# Patient Record
Sex: Female | Born: 1945 | Race: White | Hispanic: No | Smoking: Never smoker
Health system: Southern US, Community
[De-identification: ages and names within clinical notes are randomized; demographics above are authoritative.]

## PROBLEM LIST (undated history)

## (undated) DIAGNOSIS — I38 Endocarditis, valve unspecified: Secondary | ICD-10-CM

## (undated) DIAGNOSIS — R42 Dizziness and giddiness: Secondary | ICD-10-CM

## (undated) DIAGNOSIS — I4510 Unspecified right bundle-branch block: Secondary | ICD-10-CM

## (undated) DIAGNOSIS — R251 Tremor, unspecified: Secondary | ICD-10-CM

## (undated) DIAGNOSIS — M4056 Lordosis, unspecified, lumbar region: Secondary | ICD-10-CM

## (undated) DIAGNOSIS — F3160 Bipolar disorder, current episode mixed, unspecified: Secondary | ICD-10-CM

## (undated) DIAGNOSIS — N83209 Unspecified ovarian cyst, unspecified side: Secondary | ICD-10-CM

## (undated) DIAGNOSIS — H25019 Cortical age-related cataract, unspecified eye: Secondary | ICD-10-CM

## (undated) DIAGNOSIS — M1990 Unspecified osteoarthritis, unspecified site: Secondary | ICD-10-CM

## (undated) DIAGNOSIS — N39498 Other specified urinary incontinence: Secondary | ICD-10-CM

## (undated) DIAGNOSIS — E785 Hyperlipidemia, unspecified: Secondary | ICD-10-CM

## (undated) DIAGNOSIS — N189 Chronic kidney disease, unspecified: Secondary | ICD-10-CM

## (undated) DIAGNOSIS — H9190 Unspecified hearing loss, unspecified ear: Secondary | ICD-10-CM

## (undated) DIAGNOSIS — N390 Urinary tract infection, site not specified: Secondary | ICD-10-CM

## (undated) DIAGNOSIS — G629 Polyneuropathy, unspecified: Secondary | ICD-10-CM

## (undated) DIAGNOSIS — I10 Essential (primary) hypertension: Secondary | ICD-10-CM

## (undated) DIAGNOSIS — R9431 Abnormal electrocardiogram [ECG] [EKG]: Secondary | ICD-10-CM

## (undated) DIAGNOSIS — C801 Malignant (primary) neoplasm, unspecified: Secondary | ICD-10-CM

## (undated) DIAGNOSIS — I499 Cardiac arrhythmia, unspecified: Secondary | ICD-10-CM

## (undated) HISTORY — PX: TUBAL LIGATION: SHX77

## (undated) HISTORY — PX: CATARACT EXTRACTION: SUR2

## (undated) HISTORY — PX: BREAST BIOPSY: SHX20

## (undated) HISTORY — PX: EYE SURGERY: SHX253

## (undated) HISTORY — PX: DIAGNOSTIC LAPAROSCOPY: SUR761

## (undated) HISTORY — PX: BREAST EXCISIONAL BIOPSY: SUR124

## (undated) HISTORY — PX: COLONOSCOPY: SHX174

---

## 2006-11-25 ENCOUNTER — Ambulatory Visit: Payer: Self-pay | Admitting: Ophthalmology

## 2008-07-02 DIAGNOSIS — K5790 Diverticulosis of intestine, part unspecified, without perforation or abscess without bleeding: Secondary | ICD-10-CM

## 2008-07-02 HISTORY — DX: Diverticulosis of intestine, part unspecified, without perforation or abscess without bleeding: K57.90

## 2011-10-11 ENCOUNTER — Ambulatory Visit: Payer: Self-pay | Admitting: Ophthalmology

## 2011-10-31 ENCOUNTER — Ambulatory Visit: Payer: Self-pay | Admitting: Ophthalmology

## 2012-03-14 ENCOUNTER — Ambulatory Visit: Payer: Self-pay | Admitting: Gastroenterology

## 2012-07-02 HISTORY — PX: FOOT SURGERY: SHX648

## 2012-12-31 ENCOUNTER — Ambulatory Visit: Payer: Self-pay | Admitting: Podiatry

## 2013-03-05 ENCOUNTER — Ambulatory Visit: Payer: Self-pay | Admitting: Family Medicine

## 2013-12-22 DIAGNOSIS — Z85828 Personal history of other malignant neoplasm of skin: Secondary | ICD-10-CM | POA: Insufficient documentation

## 2014-03-09 ENCOUNTER — Ambulatory Visit: Payer: Self-pay | Admitting: Family Medicine

## 2014-03-24 DIAGNOSIS — F319 Bipolar disorder, unspecified: Secondary | ICD-10-CM | POA: Insufficient documentation

## 2014-03-24 DIAGNOSIS — M129 Arthropathy, unspecified: Secondary | ICD-10-CM | POA: Insufficient documentation

## 2014-10-29 ENCOUNTER — Other Ambulatory Visit: Payer: Self-pay | Admitting: Obstetrics and Gynecology

## 2014-10-29 DIAGNOSIS — N6459 Other signs and symptoms in breast: Secondary | ICD-10-CM

## 2014-10-29 DIAGNOSIS — Z13820 Encounter for screening for osteoporosis: Secondary | ICD-10-CM

## 2014-11-11 ENCOUNTER — Other Ambulatory Visit: Payer: Self-pay

## 2014-11-11 ENCOUNTER — Ambulatory Visit: Payer: Self-pay

## 2014-11-17 ENCOUNTER — Other Ambulatory Visit: Payer: Self-pay

## 2014-11-17 ENCOUNTER — Ambulatory Visit: Payer: Self-pay

## 2014-12-02 ENCOUNTER — Other Ambulatory Visit: Payer: Self-pay

## 2014-12-02 ENCOUNTER — Ambulatory Visit: Payer: Self-pay

## 2014-12-06 ENCOUNTER — Ambulatory Visit
Admission: RE | Admit: 2014-12-06 | Discharge: 2014-12-06 | Disposition: A | Discharge status: Home / Self Care | Payer: Medicare Other | Source: Ambulatory Visit | Attending: Obstetrics and Gynecology | Admitting: Obstetrics and Gynecology

## 2014-12-06 DIAGNOSIS — N6459 Other signs and symptoms in breast: Secondary | ICD-10-CM | POA: Diagnosis present

## 2014-12-06 DIAGNOSIS — Z13820 Encounter for screening for osteoporosis: Secondary | ICD-10-CM | POA: Insufficient documentation

## 2014-12-06 HISTORY — DX: Malignant (primary) neoplasm, unspecified: C80.1

## 2014-12-08 ENCOUNTER — Other Ambulatory Visit: Payer: Self-pay | Admitting: Obstetrics and Gynecology

## 2014-12-08 DIAGNOSIS — Z1231 Encounter for screening mammogram for malignant neoplasm of breast: Secondary | ICD-10-CM

## 2015-04-20 NOTE — Anesthesia Preprocedure Evaluation (Addendum)
Anesthesia Evaluation  Patient identified by MRN, date of birth, ID band  Reviewed: Allergy & Precautions, H&P , NPO status , Patient's Chart, lab work & pertinent test results  Airway Mallampati: II  TM Distance: >3 FB Neck ROM: full    Dental no notable dental hx.    Pulmonary    Pulmonary exam normal        Cardiovascular  Rhythm:regular Rate:Normal  An echocardiogram performed last year has shown mild LV systolic dysfunction with EF45% and mild mitral and tricuspid insufficiency. Heart rate and blood pressure remain well-controlled on beta blocker. She also remains on pravastatin, with LDL at goal.  No symptoms, clinically stable and cardiology felt medically optimized.   Neuro/Psych PSYCHIATRIC DISORDERS    GI/Hepatic   Endo/Other    Renal/GU      Musculoskeletal   Abdominal   Peds  Hematology   Anesthesia Other Findings   Reproductive/Obstetrics                            Anesthesia Physical Anesthesia Plan  ASA: II  Anesthesia Plan: MAC and Regional   Post-op Pain Management:    Induction:   Airway Management Planned:   Additional Equipment:   Intra-op Plan:   Post-operative Plan:   Informed Consent: I have reviewed the patients History and Physical, chart, labs and discussed the procedure including the risks, benefits and alternatives for the proposed anesthesia with the patient or authorized representative who has indicated his/her understanding and acceptance.     Plan Discussed with: CRNA  Anesthesia Plan Comments:         Anesthesia Quick Evaluation

## 2015-04-26 NOTE — Discharge Instructions (Signed)
[facility] °[facility] ° °POST OPERATIVE INSTRUCTIONS FOR DR. TROXLER AND DR. FOWLER °[facility] ° ° °1. Take your medication as prescribed.  Pain medication should be taken only as needed. ° °2. Keep the dressing clean, dry and intact. ° °3. Keep your foot elevated above the heart level for the first 48 hours. ° °4. Walking to the bathroom and brief periods of walking are acceptable, unless we have instructed you to be non-weight bearing. ° °5. Always wear your post-op shoe when walking.  Always use your crutches if you are to be non-weight bearing. ° °6. Do not take a shower. Baths are permissible as long as the foot is kept out of the water.  ° °7. Every hour you are awake:  °- Bend your knee 15 times. °- Flex foot 15 times °- Massage calf 15 times ° °8. Call [facility] ([phone]) if any of the following problems occur: °- You develop a temperature or fever. °- The bandage becomes saturated with blood. °- Medication does not stop your pain. °- Injury of the foot occurs. °- Any symptoms of infection including redness, odor, or red streaks running from wound. ° °General Anesthesia, Adult, Care After °Refer to this sheet in the next few weeks. These instructions provide you with information on caring for yourself after your procedure. Your health care provider may also give you more specific instructions. Your treatment has been planned according to current medical practices, but problems sometimes occur. Call your health care provider if you have any problems or questions after your procedure. °WHAT TO EXPECT AFTER THE PROCEDURE °After the procedure, it is typical to experience: °· Sleepiness. °· Nausea and vomiting. °HOME CARE INSTRUCTIONS °· For the first 24 hours after general anesthesia: °¨ Have a responsible person with you. °¨ Do not drive a car. If you are alone, do not take public transportation. °¨ Do not drink alcohol. °¨ Do not take  medicine that has not been prescribed by your health care provider. °¨ Do not sign important papers or make important decisions. °¨ You may resume a normal diet and activities as directed by your health care provider. °· Change bandages (dressings) as directed. °· If you have questions or problems that seem related to general anesthesia, call the hospital and ask for the anesthetist or anesthesiologist on call. °SEEK MEDICAL CARE IF: °· You have nausea and vomiting that continue the day after anesthesia. °· You develop a rash. °SEEK IMMEDIATE MEDICAL CARE IF:  °· You have difficulty breathing. °· You have chest pain. °· You have any allergic problems. °  °This information is not intended to replace advice given to you by your health care provider. Make sure you discuss any questions you have with your health care provider. °  °Document Released: 09/24/2000 Document Revised: 07/09/2014 Document Reviewed: 10/17/2011 °Elsevier Interactive Patient Education ©2016 [employer] ° °

## 2015-04-27 ENCOUNTER — Ambulatory Visit: Payer: Medicare Other | Admitting: Anesthesiology

## 2015-04-27 ENCOUNTER — Ambulatory Visit
Admission: RE | Admit: 2015-04-27 | Discharge: 2015-04-27 | Disposition: A | Discharge status: Home / Self Care | Payer: Medicare Other | Source: Ambulatory Visit | Attending: Podiatry | Admitting: Podiatry

## 2015-04-27 ENCOUNTER — Encounter
Admission: RE | Disposition: A | Discharge status: Home / Self Care | Payer: Self-pay | Source: Ambulatory Visit | Attending: Podiatry

## 2015-04-27 DIAGNOSIS — Q6621 Congenital metatarsus primus varus: Secondary | ICD-10-CM | POA: Diagnosis not present

## 2015-04-27 DIAGNOSIS — M2012 Hallux valgus (acquired), left foot: Secondary | ICD-10-CM | POA: Diagnosis not present

## 2015-04-27 DIAGNOSIS — M2042 Other hammer toe(s) (acquired), left foot: Secondary | ICD-10-CM | POA: Insufficient documentation

## 2015-04-27 HISTORY — PX: HAMMER TOE SURGERY: SHX385

## 2015-04-27 HISTORY — DX: Cardiac arrhythmia, unspecified: I49.9

## 2015-04-27 HISTORY — PX: METATARSAL OSTEOTOMY: SHX1641

## 2015-04-27 HISTORY — DX: Unspecified right bundle-branch block: I45.10

## 2015-04-27 HISTORY — DX: Urinary tract infection, site not specified: N39.0

## 2015-04-27 HISTORY — DX: Unspecified osteoarthritis, unspecified site: M19.90

## 2015-04-27 HISTORY — DX: Polyneuropathy, unspecified: G62.9

## 2015-04-27 HISTORY — DX: Unspecified hearing loss, unspecified ear: H91.90

## 2015-04-27 HISTORY — DX: Dizziness and giddiness: R42

## 2015-04-27 HISTORY — DX: Bipolar disorder, current episode mixed, unspecified: F31.60

## 2015-04-27 HISTORY — DX: Lordosis, unspecified, lumbar region: M40.56

## 2015-04-27 HISTORY — DX: Hyperlipidemia, unspecified: E78.5

## 2015-04-27 SURGERY — CORRECTION, HAMMER TOE
Anesthesia: Monitor Anesthesia Care | Laterality: Left | Wound class: Clean

## 2015-04-27 MED ORDER — PROPOFOL 500 MG/50ML IV EMUL
INTRAVENOUS | Status: DC | PRN
  Administered 2015-04-27: 100 ug/kg/min via INTRAVENOUS

## 2015-04-27 MED ORDER — ONDANSETRON HCL 4 MG/2ML IJ SOLN: 4.0000 mg | Freq: Four times a day (QID) | INTRAMUSCULAR | Status: DC | PRN

## 2015-04-27 MED ORDER — LIDOCAINE HCL (PF) 1 % IJ SOLN
INTRAMUSCULAR | Status: DC | PRN
  Administered 2015-04-27: 5 mL

## 2015-04-27 MED ORDER — ROPIVACAINE HCL 5 MG/ML IJ SOLN: INTRAMUSCULAR | Status: DC | PRN

## 2015-04-27 MED ORDER — MIDAZOLAM HCL 2 MG/2ML IJ SOLN
INTRAMUSCULAR | Status: DC | PRN
  Administered 2015-04-27: 2 mg via INTRAVENOUS
  Administered 2015-04-27: 1 mg via INTRAVENOUS

## 2015-04-27 MED ORDER — CEFAZOLIN SODIUM-DEXTROSE 2-3 GM-% IV SOLR
2.0000 g | Freq: Once | INTRAVENOUS | Status: AC
  Administered 2015-04-27: 2 g via INTRAVENOUS

## 2015-04-27 MED ORDER — ROPIVACAINE HCL 5 MG/ML IJ SOLN
INTRAMUSCULAR | Status: DC | PRN
  Administered 2015-04-27: 35 mL via PERINEURAL

## 2015-04-27 MED ORDER — ONDANSETRON HCL 4 MG/2ML IJ SOLN
INTRAMUSCULAR | Status: DC | PRN
  Administered 2015-04-27: 4 mg via INTRAVENOUS

## 2015-04-27 MED ORDER — ONDANSETRON HCL 4 MG/2ML IJ SOLN: 4.0000 mg | Freq: Once | INTRAMUSCULAR | Status: DC | PRN

## 2015-04-27 MED ORDER — FENTANYL CITRATE (PF) 100 MCG/2ML IJ SOLN
25.0000 ug | INTRAMUSCULAR | Status: DC | PRN
  Administered 2015-04-27 (×2): 25 ug via INTRAVENOUS

## 2015-04-27 MED ORDER — ACETAMINOPHEN 10 MG/ML IV SOLN
1000.0000 mg | Freq: Once | INTRAVENOUS | Status: AC
  Administered 2015-04-27: 1000 mg via INTRAVENOUS

## 2015-04-27 MED ORDER — HYDROCODONE-ACETAMINOPHEN 5-325 MG PO TABS: 1.0000 | ORAL_TABLET | Freq: Four times a day (QID) | ORAL | Status: DC | PRN

## 2015-04-27 MED ORDER — FENTANYL CITRATE (PF) 100 MCG/2ML IJ SOLN
INTRAMUSCULAR | Status: DC | PRN
  Administered 2015-04-27: 50 ug via INTRAVENOUS

## 2015-04-27 MED ORDER — DEXAMETHASONE SODIUM PHOSPHATE 4 MG/ML IJ SOLN
INTRAMUSCULAR | Status: DC | PRN
  Administered 2015-04-27: 4 mg via PERINEURAL

## 2015-04-27 MED ORDER — LACTATED RINGERS IV SOLN
INTRAVENOUS | Status: DC
  Administered 2015-04-27: 11:00:00 via INTRAVENOUS

## 2015-04-27 MED ORDER — ONDANSETRON HCL 4 MG PO TABS: 4.0000 mg | ORAL_TABLET | Freq: Four times a day (QID) | ORAL | Status: DC | PRN

## 2015-04-27 MED ORDER — EPHEDRINE SULFATE 50 MG/ML IJ SOLN
INTRAMUSCULAR | Status: DC | PRN
  Administered 2015-04-27 (×3): 5 mg via INTRAVENOUS
  Administered 2015-04-27: 10 mg via INTRAVENOUS
  Administered 2015-04-27: 5 mg via INTRAVENOUS

## 2015-04-27 SURGICAL SUPPLY — 59 items
BENZOIN TINCTURE PRP APPL 2/3 (GAUZE/BANDAGES/DRESSINGS) ×2 IMPLANT
BIT DRILL 1.5X30 QC DISP (BIT) ×2 IMPLANT
BIT DRILL 2.0 (BIT) ×1
BIT DRILL 2XNS DISP SS SM FRAG (BIT) ×1 IMPLANT
BIT DRL 2XNS DISP SS SM FRAG (BIT) ×1
BLADE MED AGGRESSIVE (BLADE) ×2 IMPLANT
BLADE MINI RND TIP GREEN BEAV (BLADE) ×2 IMPLANT
BLADE OSC/SAGITTAL 5.5X25 (BLADE) IMPLANT
BLADE OSC/SAGITTAL MD 5.5X18 (BLADE) ×2 IMPLANT
BLADE OSC/SAGITTAL MD 9X18.5 (BLADE) ×2 IMPLANT
BLADE SURG 15 STRL LF DISP TIS (BLADE) IMPLANT
BLADE SURG 15 STRL SS (BLADE)
BNDG ESMARK 4X12 TAN STRL LF (GAUZE/BANDAGES/DRESSINGS) ×2 IMPLANT
BNDG GAUZE 4.5X4.1 6PLY STRL (MISCELLANEOUS) ×2 IMPLANT
BNDG STRETCH 4X75 STRL LF (GAUZE/BANDAGES/DRESSINGS) ×2 IMPLANT
CANISTER SUCT 1200ML W/VALVE (MISCELLANEOUS) ×2 IMPLANT
CHLORAPREP W/TINT 26ML (MISCELLANEOUS) ×2 IMPLANT
COVER PIN YLW 0.028-062 (MISCELLANEOUS) IMPLANT
CUFF TOURNIQUET DUAL PORT 18X3 (MISCELLANEOUS) ×2 IMPLANT
DRAPE FLUOR MINI C-ARM 54X84 (DRAPES) ×2 IMPLANT
DURAPREP 26ML APPLICATOR (WOUND CARE) IMPLANT
FIXATION HAMMERTOE ANGLD 15MM (Toe) ×1 IMPLANT
GAUZE PETRO XEROFOAM 1X8 (MISCELLANEOUS) ×2 IMPLANT
GAUZE PETRO XEROFOAM 5X9 (MISCELLANEOUS) IMPLANT
GAUZE SPONGE 4X4 12PLY STRL (GAUZE/BANDAGES/DRESSINGS) ×2 IMPLANT
GLOVE BIO SURGEON STRL SZ7.5 (GLOVE) ×2 IMPLANT
GLOVE INDICATOR 8.0 STRL GRN (GLOVE) ×2 IMPLANT
GOWN STRL REUS W/ TWL LRG LVL3 (GOWN DISPOSABLE) ×2 IMPLANT
GOWN STRL REUS W/TWL LRG LVL3 (GOWN DISPOSABLE) ×2
HAMMERTOE ANGLED 15MM 5MM (Toe) ×2 IMPLANT
K-WIRE DBL END TROCAR 6X.045 (WIRE) ×2
K-WIRE DBL END TROCAR 6X.062 (WIRE) ×2
KIT DRILL HAMMERLOCK2 IMPLANT (BIT) ×2 IMPLANT
KWIRE DBL END TROCAR 6X.045 (WIRE) ×1 IMPLANT
KWIRE DBL END TROCAR 6X.062 (WIRE) ×1 IMPLANT
NS IRRIG 500ML POUR BTL (IV SOLUTION) ×2 IMPLANT
PACK EXTREMITY ARMC (MISCELLANEOUS) ×2 IMPLANT
PAD GROUND ADULT SPLIT (MISCELLANEOUS) ×2 IMPLANT
RASP SM TEAR CROSS CUT (RASP) ×2 IMPLANT
SCREW CORTICAL 2.0X12 (Screw) ×2 IMPLANT
SPLINT CAST 1 STEP 4X30 (MISCELLANEOUS) IMPLANT
STOCKINETTE STRL 6IN 960660 (GAUZE/BANDAGES/DRESSINGS) ×2 IMPLANT
SUT ETHILON 4-0 (SUTURE) ×1
SUT ETHILON 4-0 FS2 18XMFL BLK (SUTURE) ×1
SUT ETHILON 5-0 FS-2 18 BLK (SUTURE) IMPLANT
SUT MNCRL 4-0 (SUTURE)
SUT MNCRL 4-0 27XMFL (SUTURE)
SUT MNCRL 5-0+ PC-1 (SUTURE) ×1 IMPLANT
SUT MONOCRYL 5-0 (SUTURE) ×1
SUT VIC AB 0 CT1 36 (SUTURE) IMPLANT
SUT VIC AB 2-0 SH 27 (SUTURE)
SUT VIC AB 2-0 SH 27XBRD (SUTURE) IMPLANT
SUT VIC AB 3-0 SH 27 (SUTURE)
SUT VIC AB 3-0 SH 27X BRD (SUTURE) IMPLANT
SUT VIC AB 4-0 FS2 27 (SUTURE) ×2 IMPLANT
SUT VICRYL AB 3-0 FS1 BRD 27IN (SUTURE) ×2 IMPLANT
SUTURE ETHLN 4-0 FS2 18XMF BLK (SUTURE) ×1 IMPLANT
SUTURE MNCRL 4-0 27XMF (SUTURE) IMPLANT
k-wire ×2 IMPLANT

## 2015-04-27 NOTE — H&P (Signed)
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  The various methods of treatment have been discussed with the patient.  No guarantees were given.  After consideration of risks, benefits and other options for treatment, the patient has consented to surgery.  I have reviewed the patients' chart and labs.    Patient Vitals for the past 24 hrs:  BP Temp Temp src Pulse Resp SpO2 Height Weight  04/27/15 1200 129/63 mmHg - - (!) 48 14 100 % - -  04/27/15 1150 115/75 mmHg - - (!) 59 17 100 % - -  04/27/15 1140 97/66 mmHg - - (!) 57 12 100 % - -  04/27/15 1130 (!) 101/58 mmHg - - (!) 56 13 100 % - -  04/27/15 1125 108/61 mmHg - - (!) 57 14 100 % - -  04/27/15 1120 112/63 mmHg - - (!) 57 13 99 % - -  04/27/15 1115 (!) 106/54 mmHg - - 64 14 100 % - -  04/27/15 1110 (!) 143/72 mmHg - - 65 11 100 % - -  04/27/15 1105 139/68 mmHg - - (!) 58 (!) 22 100 % - -  04/27/15 1100 - - - 61 17 100 % - -  04/27/15 1059 (!) 150/86 mmHg 97.7 F (36.5 C) Temporal 63 16 100 % 5\' 2"  (1.575 m) 63.05 kg (139 lb)    A history and physical examination was performed in my office.  The patient was reexamined.  There have been no changes to this history and physical examination.  Will plan to perform hallux valgus corection with akin/austin procedure.  Tracey Wilson A

## 2015-04-27 NOTE — Progress Notes (Signed)
Assisted Clance Boll ANMD with popliteal block. Side rails up, monitors on throughout procedure. See vital signs in flow sheet. Tolerated Procedure well.

## 2015-04-27 NOTE — Anesthesia Procedure Notes (Addendum)
Anesthesia Regional Block:  Popliteal block  Pre-Anesthetic Checklist: ,, timeout performed, Correct Patient, Correct Site, Correct Laterality, Correct Procedure, Correct Position, site marked, Risks and benefits discussed,  Surgical consent,  Pre-op evaluation,  At surgeon's request and post-op pain management  Laterality: Left  Prep: chloraprep       Needles:  Injection technique: Single-shot  Needle Type: Echogenic Needle     Needle Length: 9cm 9 cm Needle Gauge: 21 and 21 G    Additional Needles:  Procedures: ultrasound guided (picture in chart) Popliteal block Narrative:  Start time: 04/27/2015 11:06 AM End time: 04/27/2015 11:12 AM Injection made incrementally with aspirations every 5 mL.  Performed by: Personally  Anesthesiologist: Ronelle Nigh  Additional Notes: Functioning IV was confirmed and monitors applied. Ultrasound guidance: relevant anatomy identified, needle position confirmed, local anesthetic spread visualized around nerve(s)., vascular puncture avoided.  Image printed for medical record.  Negative aspiration and no paresthesias; incremental administration of local anesthetic. The patient tolerated the procedure well. Vitals signes recorded in RN notes.  20 ml to popliteal, 15 ml to adductor canal.   Procedure Name: MAC Performed by: Mayme Genta Pre-anesthesia Checklist: Patient identified, Emergency Drugs available, Suction available, Patient being monitored and Timeout performed Patient Re-evaluated:Patient Re-evaluated prior to inductionOxygen Delivery Method: Simple face mask Placement Confirmation: positive ETCO2 and breath sounds checked- equal and bilateral

## 2015-04-27 NOTE — Transfer of Care (Signed)
Immediate Anesthesia Transfer of Care Note  Patient: Tracey Wilson  Procedure(s) Performed: Procedure(s): HAMMER TOE CORRECTION 2ND TOE (Left) METATARSAL OSTEOTOMY 1ST METATARSAL AND Proximal phalanx osteotomy. (Left)  Patient Location: PACU  Anesthesia Type: MAC, Regional  Level of Consciousness: awake, alert  and patient cooperative  Airway and Oxygen Therapy: Patient Spontanous Breathing and Patient connected to supplemental oxygen  Post-op Assessment: Post-op Vital signs reviewed, Patient's Cardiovascular Status Stable, Respiratory Function Stable, Patent Airway and No signs of Nausea or vomiting  Post-op Vital Signs: Reviewed and stable  Complications: No apparent anesthesia complications

## 2015-04-27 NOTE — Anesthesia Postprocedure Evaluation (Signed)
  Anesthesia Post-op Note  Patient: Tracey Wilson  Procedure(s) Performed: Procedure(s): HAMMER TOE CORRECTION 2ND TOE (Left) METATARSAL OSTEOTOMY 1ST METATARSAL AND Proximal phalanx osteotomy. (Left)  Anesthesia type:MAC, Regional  Patient location: PACU  Post pain: Pain level controlled  Post assessment: Post-op Vital signs reviewed, Patient's Cardiovascular Status Stable, Respiratory Function Stable, Patent Airway and No signs of Nausea or vomiting  Post vital signs: Reviewed and stable  Last Vitals:  Filed Vitals:   04/27/15 1510  BP:   Pulse: 72  Temp:   Resp: 20    Level of consciousness: awake, alert  and patient cooperative  Complications: No apparent anesthesia complications

## 2015-04-27 NOTE — Op Note (Signed)
Operative note   Surgeon:Eagan Shifflett    Assistant:none    Preop diagnosis:1.  Hallux valgus left foot.  2.  Hammertoe left 2nd toe.     Postop diagnosis:Same    Procedure:1.  Austin with Akin double osteotomy left foot.  2. Hammertoe repair left 2nd toe with hammerlock implant  3.2nd mtpj capsulotomy.    EBL: Minimal    Anesthesia:regional and IV sedation    Hemostasis: [place]f tourniquet inflated to 250 mmHg for approximately 90 minutes    Specimen: None    Complications: None    Operative indications: 69 year old female with complaint of a painful left foot hallux valgus deformity and hammertoe. She has undergone conservative treatment and presents today for surgery. The risks benefits alternatives and competitions have been discussed and consent has been given.    Procedure:  Patient was brought into the OR and placed on the operating table in thesupine position. After anesthesia was obtained theleft lower extremity was prepped and draped in usual sterile fashion.  Attention was directed to the dorsal medial left first MTPJ where an incision was performed. This was carried proximal to the MTPJ to the interphalangeal joint of the left great toe. Sharp and blunt dissection carried down to the capsule. Next a T capsulotomy was performed. The dorsomedial eminence was noted. Transected with a power saw. This was smoothed with a power rasp. Next a V osteotomy was created. The capital fragment was translocated laterally. This was then stabilized with a 0.062 K wire. The ensuing overhanging ledge was then transected and smoothed with a power rasp. Attention was then directed to the proximal phalanx where subcapsular and periosteal dissection was undertaken. The medial and lateral aspect of the proximal phalanx was opened. A 0.045 K wire was driven to the lateral portion of the proximal phalanx as a guidewire for the ensuing osteotomy. Next a wedge was taken with the apex proximal and lateral.  The wedge of bone was removed and the osteotomy was stabilized with a bone reduction forcep. Next a 2.0 mm Biomet cortical screw in lag fashion was placed from medial to lateral. Stability and compression was noted. Good alignment of the MTPJ was noted. The great toe was in a much straighter position and not loading the second toe. All wounds were flushed with copious amounts or irrigation and layered closure was performed with a 3-0 Vicryl for the capsule and 4-0 Vicryl for the subcutaneous tissue. Attention was then directed to the second toe where a dorsal incision was made from the MTPJ to the PIPJ. Sharp and blunt dissection carried down to the long extensor tendon. This was reflected at the PIPJ proximal to the MTPJ. The head of the proximal phalanx and base of the middle phalanx were resected down to good healthy bleeding bone. The proximal phalanx was still in a slight dorsal position. A dorsal medial and lateral capsulotomy was performed at the MTPJ. The plantar plate was released with a Clinical research associate. At this time much better position was noted. The PIPJ fusion was then performed with a hammerlock implant using standard technique. The alignment and compression was noted at the fusion site. All wounds were flushed with copious amounts or irrigation. The tendon was reapproximated with a 4-0 Vicryl as well as the subcutaneous tissue. Then reapproximate with a 4-0 nylon. Into the first MTPJ was reapproximated with a 5-0 Monocryl. A well compressive sterile bulky dressing was placed on the left foot.    Patient tolerated the procedure and anesthesia well.  Was transported from the OR to the PACU with all vital signs stable and vascular status intact. To be discharged per routine protocol.  Will follow up in approximately 1 week in the outpatient clinic.

## 2015-04-28 ENCOUNTER — Encounter: Payer: Self-pay | Admitting: Podiatry

## 2015-06-01 DIAGNOSIS — G609 Hereditary and idiopathic neuropathy, unspecified: Secondary | ICD-10-CM | POA: Insufficient documentation

## 2015-10-31 ENCOUNTER — Other Ambulatory Visit: Payer: Self-pay | Admitting: Obstetrics and Gynecology

## 2015-10-31 DIAGNOSIS — N6459 Other signs and symptoms in breast: Secondary | ICD-10-CM

## 2015-11-01 ENCOUNTER — Other Ambulatory Visit: Payer: Self-pay | Admitting: Obstetrics and Gynecology

## 2015-11-01 DIAGNOSIS — Z780 Asymptomatic menopausal state: Secondary | ICD-10-CM

## 2015-11-01 DIAGNOSIS — M8589 Other specified disorders of bone density and structure, multiple sites: Secondary | ICD-10-CM

## 2015-11-07 ENCOUNTER — Ambulatory Visit: Admission: RE | Admit: 2015-11-07 | Payer: Medicare Other | Source: Ambulatory Visit

## 2015-11-14 ENCOUNTER — Ambulatory Visit
Admission: RE | Admit: 2015-11-14 | Discharge: 2015-11-14 | Disposition: A | Discharge status: Home / Self Care | Payer: Medicare Other | Source: Ambulatory Visit | Attending: Obstetrics and Gynecology | Admitting: Obstetrics and Gynecology

## 2015-11-14 ENCOUNTER — Ambulatory Visit: Payer: Medicare Other

## 2015-11-14 DIAGNOSIS — N6459 Other signs and symptoms in breast: Secondary | ICD-10-CM | POA: Diagnosis not present

## 2015-12-08 ENCOUNTER — Ambulatory Visit: Payer: Medicare Other

## 2016-04-10 DIAGNOSIS — I119 Hypertensive heart disease without heart failure: Secondary | ICD-10-CM | POA: Insufficient documentation

## 2016-04-10 DIAGNOSIS — N183 Chronic kidney disease, stage 3 unspecified: Secondary | ICD-10-CM | POA: Insufficient documentation

## 2016-09-07 ENCOUNTER — Encounter (INDEPENDENT_AMBULATORY_CARE_PROVIDER_SITE_OTHER): Payer: Self-pay

## 2016-09-07 ENCOUNTER — Other Ambulatory Visit (INDEPENDENT_AMBULATORY_CARE_PROVIDER_SITE_OTHER): Payer: Self-pay | Admitting: Nephrology

## 2016-09-07 ENCOUNTER — Other Ambulatory Visit (INDEPENDENT_AMBULATORY_CARE_PROVIDER_SITE_OTHER): Payer: Medicare Other

## 2016-09-07 DIAGNOSIS — N261 Atrophy of kidney (terminal): Secondary | ICD-10-CM | POA: Diagnosis not present

## 2016-09-14 ENCOUNTER — Encounter (INDEPENDENT_AMBULATORY_CARE_PROVIDER_SITE_OTHER): Payer: Self-pay

## 2016-10-11 ENCOUNTER — Other Ambulatory Visit: Payer: Self-pay | Admitting: Family Medicine

## 2016-10-11 DIAGNOSIS — Z1239 Encounter for other screening for malignant neoplasm of breast: Secondary | ICD-10-CM

## 2016-10-23 DIAGNOSIS — I10 Essential (primary) hypertension: Secondary | ICD-10-CM | POA: Insufficient documentation

## 2016-10-24 ENCOUNTER — Encounter (INDEPENDENT_AMBULATORY_CARE_PROVIDER_SITE_OTHER): Payer: Self-pay

## 2016-11-14 ENCOUNTER — Ambulatory Visit
Admission: RE | Admit: 2016-11-14 | Discharge: 2016-11-14 | Disposition: A | Discharge status: Home / Self Care | Payer: Medicare Other | Source: Ambulatory Visit | Attending: Family Medicine | Admitting: Family Medicine

## 2016-11-14 DIAGNOSIS — Z1231 Encounter for screening mammogram for malignant neoplasm of breast: Secondary | ICD-10-CM | POA: Diagnosis not present

## 2016-11-14 DIAGNOSIS — Z1239 Encounter for other screening for malignant neoplasm of breast: Secondary | ICD-10-CM

## 2017-05-02 DIAGNOSIS — N261 Atrophy of kidney (terminal): Secondary | ICD-10-CM | POA: Insufficient documentation

## 2017-05-02 DIAGNOSIS — R339 Retention of urine, unspecified: Secondary | ICD-10-CM | POA: Insufficient documentation

## 2017-05-02 DIAGNOSIS — N3281 Overactive bladder: Secondary | ICD-10-CM | POA: Insufficient documentation

## 2017-09-25 ENCOUNTER — Encounter: Payer: Self-pay | Admitting: *Deleted

## 2017-09-26 ENCOUNTER — Encounter
Admission: RE | Disposition: A | Discharge status: Home / Self Care | Payer: Self-pay | Source: Ambulatory Visit | Attending: Gastroenterology

## 2017-09-26 ENCOUNTER — Ambulatory Visit: Payer: Medicare Other | Admitting: Anesthesiology

## 2017-09-26 ENCOUNTER — Ambulatory Visit
Admission: RE | Admit: 2017-09-26 | Discharge: 2017-09-26 | Disposition: A | Discharge status: Home / Self Care | Payer: Medicare Other | Source: Ambulatory Visit | Attending: Gastroenterology | Admitting: Gastroenterology

## 2017-09-26 ENCOUNTER — Encounter: Payer: Self-pay | Admitting: Anesthesiology

## 2017-09-26 DIAGNOSIS — Z85828 Personal history of other malignant neoplasm of skin: Secondary | ICD-10-CM | POA: Insufficient documentation

## 2017-09-26 DIAGNOSIS — Z1211 Encounter for screening for malignant neoplasm of colon: Secondary | ICD-10-CM | POA: Diagnosis present

## 2017-09-26 DIAGNOSIS — Z79899 Other long term (current) drug therapy: Secondary | ICD-10-CM | POA: Insufficient documentation

## 2017-09-26 DIAGNOSIS — E785 Hyperlipidemia, unspecified: Secondary | ICD-10-CM | POA: Insufficient documentation

## 2017-09-26 DIAGNOSIS — Z7982 Long term (current) use of aspirin: Secondary | ICD-10-CM | POA: Insufficient documentation

## 2017-09-26 DIAGNOSIS — M1990 Unspecified osteoarthritis, unspecified site: Secondary | ICD-10-CM | POA: Diagnosis not present

## 2017-09-26 DIAGNOSIS — F319 Bipolar disorder, unspecified: Secondary | ICD-10-CM | POA: Diagnosis not present

## 2017-09-26 DIAGNOSIS — I10 Essential (primary) hypertension: Secondary | ICD-10-CM | POA: Diagnosis not present

## 2017-09-26 DIAGNOSIS — K5730 Diverticulosis of large intestine without perforation or abscess without bleeding: Secondary | ICD-10-CM | POA: Diagnosis not present

## 2017-09-26 DIAGNOSIS — I4510 Unspecified right bundle-branch block: Secondary | ICD-10-CM | POA: Diagnosis not present

## 2017-09-26 DIAGNOSIS — K621 Rectal polyp: Secondary | ICD-10-CM | POA: Diagnosis not present

## 2017-09-26 DIAGNOSIS — Z800 Family history of malignant neoplasm of digestive organs: Secondary | ICD-10-CM | POA: Insufficient documentation

## 2017-09-26 DIAGNOSIS — D122 Benign neoplasm of ascending colon: Secondary | ICD-10-CM | POA: Insufficient documentation

## 2017-09-26 DIAGNOSIS — Z91013 Allergy to seafood: Secondary | ICD-10-CM | POA: Insufficient documentation

## 2017-09-26 DIAGNOSIS — M4046 Postural lordosis, lumbar region: Secondary | ICD-10-CM | POA: Diagnosis not present

## 2017-09-26 DIAGNOSIS — Z8371 Family history of colonic polyps: Secondary | ICD-10-CM | POA: Insufficient documentation

## 2017-09-26 DIAGNOSIS — G629 Polyneuropathy, unspecified: Secondary | ICD-10-CM | POA: Insufficient documentation

## 2017-09-26 DIAGNOSIS — Z889 Allergy status to unspecified drugs, medicaments and biological substances status: Secondary | ICD-10-CM | POA: Insufficient documentation

## 2017-09-26 HISTORY — DX: Chronic kidney disease, unspecified: N18.9

## 2017-09-26 HISTORY — DX: Unspecified ovarian cyst, unspecified side: N83.209

## 2017-09-26 HISTORY — DX: Hyperlipidemia, unspecified: E78.5

## 2017-09-26 HISTORY — PX: COLONOSCOPY WITH PROPOFOL: SHX5780

## 2017-09-26 HISTORY — DX: Essential (primary) hypertension: I10

## 2017-09-26 SURGERY — COLONOSCOPY WITH PROPOFOL
Anesthesia: General

## 2017-09-26 MED ORDER — PROPOFOL 10 MG/ML IV BOLUS
INTRAVENOUS | Status: DC | PRN
  Administered 2017-09-26: 100 mg via INTRAVENOUS

## 2017-09-26 MED ORDER — SODIUM CHLORIDE 0.9 % IV SOLN
INTRAVENOUS | Status: DC
  Administered 2017-09-26: 1000 mL via INTRAVENOUS

## 2017-09-26 MED ORDER — SODIUM CHLORIDE 0.9 % IV SOLN: INTRAVENOUS | Status: DC

## 2017-09-26 MED ORDER — LIDOCAINE HCL (CARDIAC) 20 MG/ML IV SOLN
INTRAVENOUS | Status: DC | PRN
  Administered 2017-09-26: 50 mg via INTRAVENOUS

## 2017-09-26 MED ORDER — LIDOCAINE HCL (PF) 2 % IJ SOLN
INTRAMUSCULAR | Status: AC
  Filled 2017-09-26: qty 10

## 2017-09-26 MED ORDER — PROPOFOL 500 MG/50ML IV EMUL
INTRAVENOUS | Status: AC
  Filled 2017-09-26: qty 50

## 2017-09-26 MED ORDER — PROPOFOL 500 MG/50ML IV EMUL
INTRAVENOUS | Status: DC | PRN
Start: 2017-09-26 — End: 2017-09-26
  Administered 2017-09-26: 130 ug/kg/min via INTRAVENOUS

## 2017-09-26 NOTE — Anesthesia Preprocedure Evaluation (Signed)
Anesthesia Evaluation  Patient identified by MRN, date of birth, ID band Patient awake    Reviewed: Allergy & Precautions, NPO status , Patient's Chart, lab work & pertinent test results, reviewed documented beta blocker date and time   Airway Mallampati: II  TM Distance: >3 FB     Dental  (+) Chipped   Pulmonary           Cardiovascular hypertension, Pt. on medications + dysrhythmias      Neuro/Psych PSYCHIATRIC DISORDERS Bipolar Disorder  Neuromuscular disease    GI/Hepatic   Endo/Other    Renal/GU Renal disease     Musculoskeletal  (+) Arthritis ,   Abdominal   Peds  Hematology   Anesthesia Other Findings Rbbb.  Reproductive/Obstetrics                             Anesthesia Physical Anesthesia Plan  ASA: III  Anesthesia Plan: General   Post-op Pain Management:    Induction: Intravenous  PONV Risk Score and Plan:   Airway Management Planned:   Additional Equipment:   Intra-op Plan:   Post-operative Plan:   Informed Consent: I have reviewed the patients History and Physical, chart, labs and discussed the procedure including the risks, benefits and alternatives for the proposed anesthesia with the patient or authorized representative who has indicated his/her understanding and acceptance.     Plan Discussed with: CRNA  Anesthesia Plan Comments:         Anesthesia Quick Evaluation

## 2017-09-26 NOTE — Transfer of Care (Signed)
Immediate Anesthesia Transfer of Care Note  Patient: Tracey Wilson  Procedure(s) Performed: COLONOSCOPY WITH PROPOFOL (N/A )  Patient Location: PACU  Anesthesia Type:General  Level of Consciousness: drowsy  Airway & Oxygen Therapy: Patient Spontanous Breathing and Patient connected to nasal cannula oxygen  Post-op Assessment: Report given to RN and Post -op Vital signs reviewed and stable  Post vital signs: Reviewed and stable  Last Vitals:  Vitals Value Taken Time  BP 100/58 09/26/2017  8:41 AM  Temp 36.2 C 09/26/2017  8:41 AM  Pulse 63 09/26/2017  8:41 AM  Resp 14 09/26/2017  8:41 AM  SpO2 100 % 09/26/2017  8:41 AM  Vitals shown include unvalidated device data.  Last Pain:  Vitals:   09/26/17 0841  TempSrc: Tympanic  PainSc: 0-No pain         Complications: No apparent anesthesia complications

## 2017-09-26 NOTE — Anesthesia Postprocedure Evaluation (Signed)
Anesthesia Post Note  Patient: Tracey Wilson  Procedure(s) Performed: COLONOSCOPY WITH PROPOFOL (N/A )  Patient location during evaluation: Endoscopy Anesthesia Type: General Level of consciousness: awake and alert Pain management: pain level controlled Vital Signs Assessment: post-procedure vital signs reviewed and stable Respiratory status: spontaneous breathing, nonlabored ventilation, respiratory function stable and patient connected to nasal cannula oxygen Cardiovascular status: blood pressure returned to baseline and stable Postop Assessment: no apparent nausea or vomiting Anesthetic complications: no     Last Vitals:  Vitals:   09/26/17 0911 09/26/17 0921  BP: (!) 153/86 (!) 153/86  Pulse: 64 61  Resp: 20 18  Temp:    SpO2: 100% 98%    Last Pain:  Vitals:   09/26/17 0851  TempSrc:   PainSc: 7                  Trygve Thal S

## 2017-09-26 NOTE — H&P (Signed)
Outpatient short stay form Pre-procedure 09/26/2017 7:40 AM Lollie Sails MD  Primary Physician: Dr. Thereasa Distance  Reason for visit: Colonoscopy  History of present illness: Patient is a 72 year old female presenting today as above.  She has a family history of colon polyps in a primary relative and colon cancer in a cousin.  Patient last had her colonoscopy in September 2013.  There were no polyps at that time.  She she tolerated prep well.  She takes no blood thinning agents with the exception of a 81 mg aspirin daily that was held today.  She takes no other aspirin products or blood thinning agents.    Current Facility-Administered Medications:  .  0.9 %  sodium chloride infusion, , Intravenous, Continuous, Lollie Sails, MD, Last Rate: 20 mL/hr at 09/26/17 0713, 1,000 mL at 09/26/17 0713 .  0.9 %  sodium chloride infusion, , Intravenous, Continuous, Lollie Sails, MD  Medications Prior to Admission  Medication Sig Dispense Refill Last Dose  . Artificial Saliva (BIOTENE MOISTURIZING MOUTH MT) Use as directed in the mouth or throat as needed.   Past Week at Unknown time  . aspirin 81 MG tablet Take 81 mg by mouth at bedtime.   Past Week at Unknown time  . Calcium Citrate-Vitamin D (CALCIUM + D PO) Take by mouth 2 (two) times daily. 600mg  Breakfast  and pm   Past Week at Unknown time  . divalproex (DEPAKOTE ER) 500 MG 24 hr tablet Take by mouth at bedtime.   09/25/2017 at Unknown time  . Garlic 7353 MG CAPS Take 1 capsule by mouth. Breakfast   Past Week at Unknown time  . Ginger, Zingiber officinalis, (GINGER ROOT) 550 MG CAPS Take 1 capsule by mouth. breakfast   Past Week at Unknown time  . Glucosamine-Chondroit-Vit C-Mn (GLUCOSAMINE CHONDR 1500 COMPLX PO) Take 1,500 mg by mouth 2 (two) times daily. Breakfast and supper   Past Week at Unknown time  . HYDROcodone-acetaminophen (NORCO) 5-325 MG tablet Take 1 tablet by mouth every 6 (six) hours as needed for moderate pain. 30  tablet 0 Past Month at Unknown time  . imipramine (TOFRANIL) 25 MG tablet Take 25 mg by mouth at bedtime.   09/25/2017 at Unknown time  . Lactobacillus (ACIDOPHILUS PO) Take by mouth daily. breakfast   Past Week at Unknown time  . MAGNESIUM CITRATE PO Take 250 mg by mouth 2 (two) times daily. Breakfast and pm   09/25/2017 at Unknown time  . Multiple Vitamins-Minerals (HAIR/SKIN/NAILS PO) Take 2 capsules by mouth 2 (two) times daily. Breakfast and 1 capsule supper   Past Week at Unknown time  . Omega-3 Fatty Acids (FISH OIL) 1200 MG CAPS Take 1 capsule by mouth 2 (two) times daily. Breakfast and supper   Past Week at Unknown time  . pravastatin (PRAVACHOL) 40 MG tablet Take 40 mg by mouth at bedtime.   09/25/2017 at Unknown time  . QUEtiapine (SEROQUEL) 50 MG tablet Take 50 mg by mouth at bedtime.   09/25/2017 at Unknown time  . sodium fluoride (PREVIDENT 5000 DRY MOUTH) 1.1 % GEL dental gel Place 1 application onto teeth at bedtime.   Past Week at Unknown time  . Wheat Dextrin (EQ FIBER POWDER PO) Take by mouth. 2 tsp.am mixed into water   Past Week at Unknown time  . zinc gluconate 50 MG tablet Take 50 mg by mouth daily. breakfast   Past Week at Unknown time  . gabapentin (NEURONTIN) 100 MG capsule Take  100 mg by mouth at bedtime.   04/26/2015 at 2130     Allergies  Allergen Reactions  . Other Swelling    Mango skin  . Shellfish-Derived Products Hives    shrimp  . Adhesive [Tape] Rash    silicone     Past Medical History:  Diagnosis Date  . Arthritis    osteo, multiple sites  . Bipolar 1 disorder, mixed ([place])   . Cancer ([place])    skin-malignant neoplasm,scc  . Chronic kidney disease   . Cyst of ovary   . Dysrhythmia    left ventricular systolic dysfunction  . HOH (hard of hearing)    right ear  . Hyperlipidemia   . Hypertension   . Lordosis of lumbar region    kyphosis  . Peripheral neuropathy    balls of feet  . RBBB (right bundle branch block)    Dr Gareth Eagle  [phone]  . Serum lipids high   . UTI (urinary tract infection)    June at [facility]  . Vertigo    dental office chairs    Review of systems:      Physical Exam    Heart and lungs: Without rub or gallop, lungs are bilaterally clear.    HEENT: Normocephalic atraumatic eyes are anicteric    Other:    Pertinant exam for procedure: Soft nontender nondistended bowel sounds positive normoactive.    Planned proceedures: Colonoscopy I have discussed the risks benefits and complications of procedures to include not limited to bleeding, infection, perforation and the risk of sedation and the patient wishes to proceed. and indicated procedures.    Lollie Sails, MD Gastroenterology 09/26/2017  7:40 AM

## 2017-09-26 NOTE — Anesthesia Post-op Follow-up Note (Signed)
Anesthesia QCDR form completed.        

## 2017-09-26 NOTE — Op Note (Signed)
[facility] Gastroenterology Patient Name: Tracey Wilson Procedure Date: 09/26/2017 7:19 AM MRN: [id number] Account #: [id number] Date of Birth: 23-Aug-1945 Admit Type: Outpatient Age: 72 Room: [facility] [place] ENDO ROOM 1 Gender: Female Note Status: Finalized Procedure:            Colonoscopy Indications:          Family history of colonic polyps in a first-degree                        relative Providers:            Lollie Sails, MD Referring MD:         [name] (Referring MD) Medicines:            Monitored Anesthesia Care Complications:        No immediate complications. Procedure:            Pre-Anesthesia Assessment:                       - ASA Grade Assessment: III - A patient with severe                        systemic disease.                       After obtaining informed consent, the colonoscope was                        passed under direct vision. Throughout the procedure,                        the patient's blood pressure, pulse, and oxygen                        saturations were monitored continuously. The                        Colonoscope was introduced through the anus and                        advanced to the the cecum, identified by appendiceal                        orifice and ileocecal valve. The colonoscopy was                        performed without difficulty. The colonoscopy was                        unusually difficult due to a tortuous colon. The                        patient tolerated the procedure well. The quality of                        the bowel preparation was good. The patient tolerated                        the procedure well. Findings:      Two sessile polyps were found in the recto-sigmoid colon. The polyps  were 1 to 2 mm in size. These polyps were removed with a cold biopsy       forceps. Resection and retrieval were complete.      Three sessile polyps were found in the ascending colon. The polyps were    4 to 11 mm in size. These polyps were removed with a lift and cut       technique using a cold snare and cold forcep. Resection and retrieval       were complete, good hemostasis.      A few small-mouthed diverticula were found in the sigmoid colon and       descending colon.      The digital rectal exam was normal.      The exam was otherwise without abnormality. Impression:           - Two 1 to 2 mm polyps at the recto-sigmoid colon,                        removed with a cold biopsy forceps. Resected and                        retrieved.                       - Three 4 to 11 mm polyps in the ascending colon,                        removed using lift and cut and a cold snare. Resected                        and retrieved.                       - Diverticulosis in the sigmoid colon and in the                        descending colon. Recommendation:       - Discharge patient to home.                       - Telephone GI clinic for pathology results in 1 week.                       - Full liquid diet today, then advance as tolerated to                        soft diet. Procedure Code(s):    --- Professional ---                       [phone], Colonoscopy, flexible; with removal of tumor(s),                        polyp(s), or other lesion(s) by snare technique                       19147, 83, Colonoscopy, flexible; with biopsy, single                        or multiple Diagnosis Code(s):    --- Professional ---  D12.7, Benign neoplasm of rectosigmoid junction                       D12.2, Benign neoplasm of ascending colon                       Z83.71, Family history of colonic polyps                       K57.30, Diverticulosis of large intestine without                        perforation or abscess without bleeding CPT copyright 2016 American Medical Association. All rights reserved. The codes documented in this report are preliminary and upon coder review may  be  revised to meet current compliance requirements. Lollie Sails, MD 09/26/2017 8:42:22 AM This report has been signed electronically. Number of Addenda: 0 Note Initiated On: 09/26/2017 7:19 AM Scope Withdrawal Time: 0 hours 19 minutes 21 seconds  Total Procedure Duration: 0 hours 49 minutes 55 seconds       [facility]

## 2017-09-27 LAB — SURGICAL PATHOLOGY

## 2017-09-30 ENCOUNTER — Encounter: Payer: Self-pay | Admitting: Gastroenterology

## 2017-10-09 ENCOUNTER — Other Ambulatory Visit: Payer: Self-pay | Admitting: Family Medicine

## 2017-10-09 DIAGNOSIS — Z1231 Encounter for screening mammogram for malignant neoplasm of breast: Secondary | ICD-10-CM

## 2017-11-18 ENCOUNTER — Ambulatory Visit
Admission: RE | Admit: 2017-11-18 | Discharge: 2017-11-18 | Disposition: A | Discharge status: Home / Self Care | Payer: Medicare Other | Source: Ambulatory Visit | Attending: Family Medicine | Admitting: Family Medicine

## 2017-11-18 DIAGNOSIS — Z1231 Encounter for screening mammogram for malignant neoplasm of breast: Secondary | ICD-10-CM | POA: Diagnosis present

## 2017-12-18 ENCOUNTER — Other Ambulatory Visit: Payer: Self-pay | Admitting: Nephrology

## 2017-12-20 ENCOUNTER — Other Ambulatory Visit: Payer: Medicare Other

## 2018-05-05 ENCOUNTER — Encounter
Admission: RE | Admit: 2018-05-05 | Discharge: 2018-05-05 | Disposition: A | Discharge status: Home / Self Care | Payer: Medicare Other | Source: Ambulatory Visit | Attending: Nephrology | Admitting: Nephrology

## 2018-05-05 MED ORDER — TECHNETIUM TC 99M SESTAMIBI GENERIC - CARDIOLITE
23.2000 | Freq: Once | INTRAVENOUS | Status: AC | PRN
  Administered 2018-05-05: 23.2 via INTRAVENOUS

## 2018-07-08 ENCOUNTER — Other Ambulatory Visit: Payer: Self-pay | Admitting: Podiatry

## 2018-07-10 HISTORY — PX: MOHS SURGERY: SUR867

## 2018-07-10 NOTE — Discharge Instructions (Signed)
[facility] °[facility] ° °POST OPERATIVE INSTRUCTIONS FOR DR. TROXLER AND DR. FOWLER °[facility] ° ° °1. Take your medication as prescribed.  Pain medication should be taken only as needed. ° °2. Keep the dressing clean, dry and intact. ° °3. Keep your foot elevated above the heart level for the first 48 hours. ° °4. Walking to the bathroom and brief periods of walking are acceptable, unless we have instructed you to be non-weight bearing. ° °5. Always wear your post-op shoe when walking.  Always use your crutches if you are to be non-weight bearing. ° °6. Do not take a shower. Baths are permissible as long as the foot is kept out of the water.  ° °7. Every hour you are awake:  °- Bend your knee 15 times. °- Flex foot 15 times °- Massage calf 15 times ° °8. Call [facility] ([phone]) if any of the following problems occur: °- You develop a temperature or fever. °- The bandage becomes saturated with blood. °- Medication does not stop your pain. °- Injury of the foot occurs. °- Any symptoms of infection including redness, odor, or red streaks running from wound. ° ° °General Anesthesia, Adult, Care After °This sheet gives you information about how to care for yourself after your procedure. Your health care provider may also give you more specific instructions. If you have problems or questions, contact your health care provider. °What can I expect after the procedure? °After the procedure, the following side effects are common: °· Pain or discomfort at the IV site. °· Nausea. °· Vomiting. °· Sore throat. °· Trouble concentrating. °· Feeling cold or chills. °· Weak or tired. °· Sleepiness and fatigue. °· Soreness and body aches. These side effects can affect parts of the body that were not involved in surgery. °Follow these instructions at home: ° °For at least 24 hours after the procedure: °· Have a responsible adult stay with you. It is important to  have someone help care for you until you are awake and alert. °· Rest as needed. °· Do not: °? Participate in activities in which you could fall or become injured. °? Drive. °? Use heavy machinery. °? Drink alcohol. °? Take sleeping pills or medicines that cause drowsiness. °? Make important decisions or sign legal documents. °? Take care of children on your own. °Eating and drinking °· Follow any instructions from your health care provider about eating or drinking restrictions. °· When you feel hungry, start by eating small amounts of foods that are soft and easy to digest (bland), such as toast. Gradually return to your regular diet. °· Drink enough fluid to keep your urine pale yellow. °· If you vomit, rehydrate by drinking water, juice, or clear broth. °General instructions °· If you have sleep apnea, surgery and certain medicines can increase your risk for breathing problems. Follow instructions from your health care provider about wearing your sleep device: °? Anytime you are sleeping, including during daytime naps. °? While taking prescription pain medicines, sleeping medicines, or medicines that make you drowsy. °· Return to your normal activities as told by your health care provider. Ask your health care provider what activities are safe for you. °· Take over-the-counter and prescription medicines only as told by your health care provider. °· If you smoke, do not smoke without supervision. °· Keep all follow-up visits as told by your health care provider. This is important. °Contact a health care provider if: °· You have   nausea or vomiting that does not get better with medicine. °· You cannot eat or drink without vomiting. °· You have pain that does not get better with medicine. °· You are unable to pass urine. °· You develop a skin rash. °· You have a fever. °· You have redness around your IV site that gets worse. °Get help right away if: °· You have difficulty breathing. °· You have chest pain. °· You  have blood in your urine or stool, or you vomit blood. °Summary °· After the procedure, it is common to have a sore throat or nausea. It is also common to feel tired. °· Have a responsible adult stay with you for the first 24 hours after general anesthesia. It is important to have someone help care for you until you are awake and alert. °· When you feel hungry, start by eating small amounts of foods that are soft and easy to digest (bland), such as toast. Gradually return to your regular diet. °· Drink enough fluid to keep your urine pale yellow. °· Return to your normal activities as told by your health care provider. Ask your health care provider what activities are safe for you. °This information is not intended to replace advice given to you by your health care provider. Make sure you discuss any questions you have with your health care provider. °Document Released: 09/24/2000 Document Revised: 02/01/2017 Document Reviewed: 02/01/2017 °Elsevier Interactive Patient Education © 2019 Elsevier Inc. ° °

## 2018-07-14 ENCOUNTER — Other Ambulatory Visit: Payer: Self-pay

## 2018-07-14 ENCOUNTER — Encounter: Payer: Self-pay | Admitting: *Deleted

## 2018-07-14 ENCOUNTER — Encounter: Payer: Self-pay | Admitting: Anesthesiology

## 2018-07-17 ENCOUNTER — Ambulatory Visit: Admit: 2018-07-17 | Payer: Medicare Other | Admitting: Podiatry

## 2018-07-17 ENCOUNTER — Other Ambulatory Visit: Payer: Self-pay | Admitting: Podiatry

## 2018-07-17 SURGERY — REMOVAL FOREIGN BODY EXTREMITY
Anesthesia: Choice | Laterality: Left

## 2018-09-09 ENCOUNTER — Encounter: Payer: Self-pay | Admitting: *Deleted

## 2018-09-09 ENCOUNTER — Other Ambulatory Visit: Payer: Self-pay | Admitting: Podiatry

## 2018-09-09 ENCOUNTER — Other Ambulatory Visit: Payer: Self-pay

## 2018-09-10 ENCOUNTER — Encounter: Payer: Self-pay | Admitting: Anesthesiology

## 2018-09-16 NOTE — Anesthesia Preprocedure Evaluation (Deleted)
Anesthesia Evaluation    Airway        Dental   Pulmonary           Cardiovascular hypertension, + dysrhythmias (RBBB)      Neuro/Psych PSYCHIATRIC DISORDERS Bipolar Disorder Vertigo; HOH  Neuromuscular disease (peripheral neuropathy)    GI/Hepatic   Endo/Other    Renal/GU Renal disease (CKD)     Musculoskeletal   Abdominal   Peds  Hematology Skin CA   Anesthesia Other Findings   Reproductive/Obstetrics                             Anesthesia Physical Anesthesia Plan  ASA: II  Anesthesia Plan: General   Post-op Pain Management:    Induction: Intravenous  PONV Risk Score and Plan: 3 and Ondansetron and Treatment may vary due to age or medical condition  Airway Management Planned: LMA  Additional Equipment:   Intra-op Plan:   Post-operative Plan: Extubation in OR  Informed Consent:   Plan Discussed with:   Anesthesia Plan Comments:         Anesthesia Quick Evaluation

## 2018-09-17 NOTE — Discharge Instructions (Signed)
[facility] °[facility] ° °POST OPERATIVE INSTRUCTIONS FOR DR. TROXLER AND DR. FOWLER °[facility] ° ° °1. Take your medication as prescribed.  Pain medication should be taken only as needed. ° °2. Keep the dressing clean, dry and intact. ° °3. Keep your foot elevated above the heart level for the first 48 hours. ° °4. Walking to the bathroom and brief periods of walking are acceptable, unless we have instructed you to be non-weight bearing. ° °5. Always wear your post-op shoe when walking.  Always use your crutches if you are to be non-weight bearing. ° °6. Do not take a shower. Baths are permissible as long as the foot is kept out of the water.  ° °7. Every hour you are awake:  °- Bend your knee 15 times. °- Flex foot 15 times °- Massage calf 15 times ° °8. Call [facility] ([phone]) if any of the following problems occur: °- You develop a temperature or fever. °- The bandage becomes saturated with blood. °- Medication does not stop your pain. °- Injury of the foot occurs. °- Any symptoms of infection including redness, odor, or red streaks running from wound. ° ° °General Anesthesia, Adult, Care After °This sheet gives you information about how to care for yourself after your procedure. Your health care provider may also give you more specific instructions. If you have problems or questions, contact your health care provider. °What can I expect after the procedure? °After the procedure, the following side effects are common: °· Pain or discomfort at the IV site. °· Nausea. °· Vomiting. °· Sore throat. °· Trouble concentrating. °· Feeling cold or chills. °· Weak or tired. °· Sleepiness and fatigue. °· Soreness and body aches. These side effects can affect parts of the body that were not involved in surgery. °Follow these instructions at home: ° °For at least 24 hours after the procedure: °· Have a responsible adult stay with you. It is important to  have someone help care for you until you are awake and alert. °· Rest as needed. °· Do not: °? Participate in activities in which you could fall or become injured. °? Drive. °? Use heavy machinery. °? Drink alcohol. °? Take sleeping pills or medicines that cause drowsiness. °? Make important decisions or sign legal documents. °? Take care of children on your own. °Eating and drinking °· Follow any instructions from your health care provider about eating or drinking restrictions. °· When you feel hungry, start by eating small amounts of foods that are soft and easy to digest (bland), such as toast. Gradually return to your regular diet. °· Drink enough fluid to keep your urine pale yellow. °· If you vomit, rehydrate by drinking water, juice, or clear broth. °General instructions °· If you have sleep apnea, surgery and certain medicines can increase your risk for breathing problems. Follow instructions from your health care provider about wearing your sleep device: °? Anytime you are sleeping, including during daytime naps. °? While taking prescription pain medicines, sleeping medicines, or medicines that make you drowsy. °· Return to your normal activities as told by your health care provider. Ask your health care provider what activities are safe for you. °· Take over-the-counter and prescription medicines only as told by your health care provider. °· If you smoke, do not smoke without supervision. °· Keep all follow-up visits as told by your health care provider. This is important. °Contact a health care provider if: °· You have   nausea or vomiting that does not get better with medicine. °· You cannot eat or drink without vomiting. °· You have pain that does not get better with medicine. °· You are unable to pass urine. °· You develop a skin rash. °· You have a fever. °· You have redness around your IV site that gets worse. °Get help right away if: °· You have difficulty breathing. °· You have chest pain. °· You  have blood in your urine or stool, or you vomit blood. °Summary °· After the procedure, it is common to have a sore throat or nausea. It is also common to feel tired. °· Have a responsible adult stay with you for the first 24 hours after general anesthesia. It is important to have someone help care for you until you are awake and alert. °· When you feel hungry, start by eating small amounts of foods that are soft and easy to digest (bland), such as toast. Gradually return to your regular diet. °· Drink enough fluid to keep your urine pale yellow. °· Return to your normal activities as told by your health care provider. Ask your health care provider what activities are safe for you. °This information is not intended to replace advice given to you by your health care provider. Make sure you discuss any questions you have with your health care provider. °Document Released: 09/24/2000 Document Revised: 02/01/2017 Document Reviewed: 02/01/2017 °Elsevier Interactive Patient Education © 2019 Elsevier Inc. ° °

## 2018-09-18 ENCOUNTER — Ambulatory Visit: Admission: RE | Admit: 2018-09-18 | Payer: Medicare Other | Source: Home / Self Care | Admitting: Podiatry

## 2018-09-18 SURGERY — REMOVAL FOREIGN BODY EXTREMITY
Anesthesia: Choice | Laterality: Left

## 2018-09-29 ENCOUNTER — Telehealth: Payer: Medicare Other | Admitting: Nurse Practitioner

## 2018-09-29 DIAGNOSIS — K591 Functional diarrhea: Secondary | ICD-10-CM

## 2018-09-29 NOTE — Progress Notes (Signed)
We are sorry that you are not feeling well.  Here is how we plan to help!  Based on what you have shared with me it looks like you have Acute Infectious Diarrhea.  Most cases of acute diarrhea are due to infections with virus and bacteria and are self-limited conditions lasting less than 14 days.  For your symptoms you may take Imodium 2 mg tablets that are over the counter at your local pharmacy. Take two tablet now and then one after each loose stool up to 6 a day.  Antibiotics are not needed for most people with diarrhea.   HOME CARE  We recommend changing your diet to help with your symptoms for the next few days.  Drink plenty of fluids that contain water salt and sugar. Sports drinks such as Gatorade may help.   You may try broths, soups, bananas, applesauce, soft breads, mashed potatoes or crackers.   You are considered infectious for as long as the diarrhea continues. Hand washing or use of alcohol based hand sanitizers is recommend.  It is best to stay out of work or school until your symptoms stop.   GET HELP RIGHT AWAY  If you have dark yellow colored urine or do not pass urine frequently you should drink more fluids.    If your symptoms worsen   If you feel like you are going to pass out (faint)  You have a new problem  MAKE SURE YOU   Understand these instructions.  Will watch your condition.  Will get help right away if you are not doing well or get worse.  Your e-visit answers were reviewed by a board certified advanced clinical practitioner to complete your personal care plan.  Depending on the condition, your plan could have included both over the counter or prescription medications.  If there is a problem please reply  once you have received a response from your provider.  Your safety is important to [place].  If you have drug allergies check your prescription carefully.    You can use MyChart to ask questions about today's visit, request a non-urgent call  back, or ask for a work or school excuse for 24 hours related to this e-Visit. If it has been greater than 24 hours you will need to follow up with your provider, or enter a new e-Visit to address those concerns.   You will get an e-mail in the next two days asking about your experience.  I hope that your e-visit has been valuable and will speed your recovery. Thank you for using e-visits.  5 minutes spent reviewing and documenting in chart.

## 2018-10-14 ENCOUNTER — Telehealth: Payer: Medicare Other | Admitting: Nurse Practitioner

## 2018-10-14 DIAGNOSIS — S6991XA Unspecified injury of right wrist, hand and finger(s), initial encounter: Secondary | ICD-10-CM

## 2018-10-14 NOTE — Progress Notes (Signed)
*   called patient  For more clarification. She said she fell 2 weeks ago. What is really hurting is her lright fifth finger is crooked. She does not think she broke her hip. She is not able to walk due to trouble with her foot not related to fall. She denies back pain at all. She is just concerned about her finger.  Based on what you shared with me it looks like you have broke finger,that should be evaluated in a face to face office visit. Need xray of finger.   NOTE: If you entered your credit card information for this eVisit, you will not be charged. You may see a "hold" on your card for the $30 but that hold will drop off and you will not have a charge processed.  If you are having a true medical emergency please call 911.  If you need an urgent face to face visit, [place] has four urgent care centers for your convenience.  If you need care fast and have a high deductible or no insurance consider:   [email or web address] to reserve your spot online an avoid wait times  [facility] [address], [address] [place], [place] [address] 8 am to 8 pm Monday-Friday 10 am to 4 pm Saturday-Sunday *Across the street from [employer]  [place], [address] 8 am to 5 pm Monday-Friday * In the [facility] on the [facility]   The following sites will take your  insurance:  . [facility] [place] a Provider at this Location  [address] [place], [place] [address] . 10 am to 8 pm Monday-Friday . 12 pm to 8 pm Saturday-Sunday   . [facility] [place] a Provider at this Location  [address] [place], [place], [place] [address] . 8 am to 8 pm Monday-Friday . 9 am to 6 pm Saturday . 11 am to 6 pm Sunday   . [facility] [place] Get Driving  Directions  [address] [place], [place] [address] . 8 am to 8 pm Monday-Friday . 8 am to 4 pm Saturday-Sunday   Your e-visit answers were reviewed by a board certified advanced clinical practitioner to complete your personal care plan.  10 minutes spent reviewing and documenting in chart.

## 2018-10-20 ENCOUNTER — Other Ambulatory Visit: Payer: Self-pay | Admitting: Family Medicine

## 2018-10-20 DIAGNOSIS — Z1231 Encounter for screening mammogram for malignant neoplasm of breast: Secondary | ICD-10-CM

## 2018-10-24 DIAGNOSIS — E042 Nontoxic multinodular goiter: Secondary | ICD-10-CM | POA: Insufficient documentation

## 2018-11-26 ENCOUNTER — Other Ambulatory Visit: Payer: Self-pay | Admitting: Podiatry

## 2018-11-27 ENCOUNTER — Encounter: Payer: Self-pay | Admitting: *Deleted

## 2018-11-27 ENCOUNTER — Other Ambulatory Visit: Payer: Self-pay

## 2018-11-27 NOTE — Discharge Instructions (Signed)
[facility] °[facility] ° °POST OPERATIVE INSTRUCTIONS FOR DR. TROXLER AND DR. FOWLER °[facility] ° ° °1. Take your medication as prescribed.  Pain medication should be taken only as needed. ° °2. Keep the dressing clean, dry and intact. ° °3. Keep your foot elevated above the heart level for the first 48 hours. ° °4. Walking to the bathroom and brief periods of walking are acceptable, unless we have instructed you to be non-weight bearing. ° °5. Always wear your post-op shoe when walking.  Always use your crutches if you are to be non-weight bearing. ° °6. Do not take a shower. Baths are permissible as long as the foot is kept out of the water.  ° °7. Every hour you are awake:  °- Bend your knee 15 times. °- Flex foot 15 times °- Massage calf 15 times ° °8. Call [facility] ([phone]) if any of the following problems occur: °- You develop a temperature or fever. °- The bandage becomes saturated with blood. °- Medication does not stop your pain. °- Injury of the foot occurs. °- Any symptoms of infection including redness, odor, or red streaks running from wound. ° ° °General Anesthesia, Adult, Care After °This sheet gives you information about how to care for yourself after your procedure. Your health care provider may also give you more specific instructions. If you have problems or questions, contact your health care provider. °What can I expect after the procedure? °After the procedure, the following side effects are common: °· Pain or discomfort at the IV site. °· Nausea. °· Vomiting. °· Sore throat. °· Trouble concentrating. °· Feeling cold or chills. °· Weak or tired. °· Sleepiness and fatigue. °· Soreness and body aches. These side effects can affect parts of the body that were not involved in surgery. °Follow these instructions at home: ° °For at least 24 hours after the procedure: °· Have a responsible adult stay with you. It is important to  have someone help care for you until you are awake and alert. °· Rest as needed. °· Do not: °? Participate in activities in which you could fall or become injured. °? Drive. °? Use heavy machinery. °? Drink alcohol. °? Take sleeping pills or medicines that cause drowsiness. °? Make important decisions or sign legal documents. °? Take care of children on your own. °Eating and drinking °· Follow any instructions from your health care provider about eating or drinking restrictions. °· When you feel hungry, start by eating small amounts of foods that are soft and easy to digest (bland), such as toast. Gradually return to your regular diet. °· Drink enough fluid to keep your urine pale yellow. °· If you vomit, rehydrate by drinking water, juice, or clear broth. °General instructions °· If you have sleep apnea, surgery and certain medicines can increase your risk for breathing problems. Follow instructions from your health care provider about wearing your sleep device: °? Anytime you are sleeping, including during daytime naps. °? While taking prescription pain medicines, sleeping medicines, or medicines that make you drowsy. °· Return to your normal activities as told by your health care provider. Ask your health care provider what activities are safe for you. °· Take over-the-counter and prescription medicines only as told by your health care provider. °· If you smoke, do not smoke without supervision. °· Keep all follow-up visits as told by your health care provider. This is important. °Contact a health care provider if: °· You have   nausea or vomiting that does not get better with medicine. °· You cannot eat or drink without vomiting. °· You have pain that does not get better with medicine. °· You are unable to pass urine. °· You develop a skin rash. °· You have a fever. °· You have redness around your IV site that gets worse. °Get help right away if: °· You have difficulty breathing. °· You have chest pain. °· You  have blood in your urine or stool, or you vomit blood. °Summary °· After the procedure, it is common to have a sore throat or nausea. It is also common to feel tired. °· Have a responsible adult stay with you for the first 24 hours after general anesthesia. It is important to have someone help care for you until you are awake and alert. °· When you feel hungry, start by eating small amounts of foods that are soft and easy to digest (bland), such as toast. Gradually return to your regular diet. °· Drink enough fluid to keep your urine pale yellow. °· Return to your normal activities as told by your health care provider. Ask your health care provider what activities are safe for you. °This information is not intended to replace advice given to you by your health care provider. Make sure you discuss any questions you have with your health care provider. °Document Released: 09/24/2000 Document Revised: 02/01/2017 Document Reviewed: 02/01/2017 °Elsevier Interactive Patient Education © 2019 [employer] ° °

## 2018-12-01 ENCOUNTER — Other Ambulatory Visit: Payer: Self-pay

## 2018-12-01 ENCOUNTER — Encounter
Admission: RE | Admit: 2018-12-01 | Discharge: 2018-12-01 | Disposition: A | Discharge status: Home / Self Care | Payer: Medicare Other | Source: Ambulatory Visit | Attending: Podiatry | Admitting: Podiatry

## 2018-12-01 DIAGNOSIS — Z01812 Encounter for preprocedural laboratory examination: Secondary | ICD-10-CM | POA: Insufficient documentation

## 2018-12-01 DIAGNOSIS — M2042 Other hammer toe(s) (acquired), left foot: Secondary | ICD-10-CM | POA: Diagnosis not present

## 2018-12-01 DIAGNOSIS — Z79899 Other long term (current) drug therapy: Secondary | ICD-10-CM | POA: Diagnosis not present

## 2018-12-01 DIAGNOSIS — F319 Bipolar disorder, unspecified: Secondary | ICD-10-CM | POA: Diagnosis not present

## 2018-12-01 DIAGNOSIS — Z85828 Personal history of other malignant neoplasm of skin: Secondary | ICD-10-CM | POA: Diagnosis not present

## 2018-12-01 DIAGNOSIS — Z9842 Cataract extraction status, left eye: Secondary | ICD-10-CM | POA: Diagnosis not present

## 2018-12-01 DIAGNOSIS — Z9841 Cataract extraction status, right eye: Secondary | ICD-10-CM | POA: Diagnosis not present

## 2018-12-01 DIAGNOSIS — G629 Polyneuropathy, unspecified: Secondary | ICD-10-CM | POA: Diagnosis not present

## 2018-12-01 DIAGNOSIS — M2032 Hallux varus (acquired), left foot: Secondary | ICD-10-CM | POA: Diagnosis not present

## 2018-12-01 DIAGNOSIS — T84213A Breakdown (mechanical) of internal fixation device of bones of foot and toes, initial encounter: Secondary | ICD-10-CM | POA: Diagnosis not present

## 2018-12-01 DIAGNOSIS — I38 Endocarditis, valve unspecified: Secondary | ICD-10-CM | POA: Diagnosis not present

## 2018-12-01 DIAGNOSIS — N183 Chronic kidney disease, stage 3 (moderate): Secondary | ICD-10-CM | POA: Diagnosis not present

## 2018-12-01 DIAGNOSIS — K5790 Diverticulosis of intestine, part unspecified, without perforation or abscess without bleeding: Secondary | ICD-10-CM | POA: Diagnosis not present

## 2018-12-01 DIAGNOSIS — Z1159 Encounter for screening for other viral diseases: Secondary | ICD-10-CM | POA: Insufficient documentation

## 2018-12-01 DIAGNOSIS — X58XXXA Exposure to other specified factors, initial encounter: Secondary | ICD-10-CM | POA: Diagnosis not present

## 2018-12-01 DIAGNOSIS — Z9109 Other allergy status, other than to drugs and biological substances: Secondary | ICD-10-CM | POA: Diagnosis not present

## 2018-12-01 DIAGNOSIS — M1990 Unspecified osteoarthritis, unspecified site: Secondary | ICD-10-CM | POA: Diagnosis not present

## 2018-12-01 DIAGNOSIS — Z91018 Allergy to other foods: Secondary | ICD-10-CM | POA: Diagnosis not present

## 2018-12-01 DIAGNOSIS — Z91040 Latex allergy status: Secondary | ICD-10-CM | POA: Diagnosis not present

## 2018-12-01 DIAGNOSIS — Z91013 Allergy to seafood: Secondary | ICD-10-CM | POA: Diagnosis not present

## 2018-12-01 DIAGNOSIS — I129 Hypertensive chronic kidney disease with stage 1 through stage 4 chronic kidney disease, or unspecified chronic kidney disease: Secondary | ICD-10-CM | POA: Diagnosis not present

## 2018-12-01 DIAGNOSIS — M205X2 Other deformities of toe(s) (acquired), left foot: Secondary | ICD-10-CM | POA: Diagnosis present

## 2018-12-01 DIAGNOSIS — E785 Hyperlipidemia, unspecified: Secondary | ICD-10-CM | POA: Diagnosis not present

## 2018-12-04 ENCOUNTER — Ambulatory Visit
Admission: RE | Admit: 2018-12-04 | Discharge: 2018-12-04 | Disposition: A | Discharge status: Home / Self Care | Payer: Medicare Other | Attending: Podiatry | Admitting: Podiatry

## 2018-12-04 ENCOUNTER — Ambulatory Visit: Payer: Medicare Other | Admitting: Anesthesiology

## 2018-12-04 ENCOUNTER — Encounter
Admission: RE | Disposition: A | Discharge status: Home / Self Care | Payer: Self-pay | Source: Home / Self Care | Attending: Podiatry

## 2018-12-04 DIAGNOSIS — E785 Hyperlipidemia, unspecified: Secondary | ICD-10-CM | POA: Insufficient documentation

## 2018-12-04 DIAGNOSIS — F319 Bipolar disorder, unspecified: Secondary | ICD-10-CM | POA: Insufficient documentation

## 2018-12-04 DIAGNOSIS — Z9842 Cataract extraction status, left eye: Secondary | ICD-10-CM | POA: Insufficient documentation

## 2018-12-04 DIAGNOSIS — X58XXXA Exposure to other specified factors, initial encounter: Secondary | ICD-10-CM | POA: Insufficient documentation

## 2018-12-04 DIAGNOSIS — Z85828 Personal history of other malignant neoplasm of skin: Secondary | ICD-10-CM | POA: Insufficient documentation

## 2018-12-04 DIAGNOSIS — Z9109 Other allergy status, other than to drugs and biological substances: Secondary | ICD-10-CM | POA: Insufficient documentation

## 2018-12-04 DIAGNOSIS — M1990 Unspecified osteoarthritis, unspecified site: Secondary | ICD-10-CM | POA: Insufficient documentation

## 2018-12-04 DIAGNOSIS — Z91013 Allergy to seafood: Secondary | ICD-10-CM | POA: Insufficient documentation

## 2018-12-04 DIAGNOSIS — M2032 Hallux varus (acquired), left foot: Secondary | ICD-10-CM | POA: Diagnosis not present

## 2018-12-04 DIAGNOSIS — T84213A Breakdown (mechanical) of internal fixation device of bones of foot and toes, initial encounter: Secondary | ICD-10-CM | POA: Insufficient documentation

## 2018-12-04 DIAGNOSIS — Z79899 Other long term (current) drug therapy: Secondary | ICD-10-CM | POA: Insufficient documentation

## 2018-12-04 DIAGNOSIS — M2042 Other hammer toe(s) (acquired), left foot: Secondary | ICD-10-CM | POA: Insufficient documentation

## 2018-12-04 DIAGNOSIS — Z9841 Cataract extraction status, right eye: Secondary | ICD-10-CM | POA: Insufficient documentation

## 2018-12-04 DIAGNOSIS — Z91018 Allergy to other foods: Secondary | ICD-10-CM | POA: Insufficient documentation

## 2018-12-04 DIAGNOSIS — M205X2 Other deformities of toe(s) (acquired), left foot: Secondary | ICD-10-CM | POA: Insufficient documentation

## 2018-12-04 DIAGNOSIS — Z91040 Latex allergy status: Secondary | ICD-10-CM | POA: Insufficient documentation

## 2018-12-04 DIAGNOSIS — I129 Hypertensive chronic kidney disease with stage 1 through stage 4 chronic kidney disease, or unspecified chronic kidney disease: Secondary | ICD-10-CM | POA: Insufficient documentation

## 2018-12-04 DIAGNOSIS — Z1159 Encounter for screening for other viral diseases: Secondary | ICD-10-CM | POA: Insufficient documentation

## 2018-12-04 DIAGNOSIS — K5790 Diverticulosis of intestine, part unspecified, without perforation or abscess without bleeding: Secondary | ICD-10-CM | POA: Insufficient documentation

## 2018-12-04 DIAGNOSIS — N183 Chronic kidney disease, stage 3 (moderate): Secondary | ICD-10-CM | POA: Insufficient documentation

## 2018-12-04 DIAGNOSIS — I38 Endocarditis, valve unspecified: Secondary | ICD-10-CM | POA: Insufficient documentation

## 2018-12-04 DIAGNOSIS — G629 Polyneuropathy, unspecified: Secondary | ICD-10-CM | POA: Insufficient documentation

## 2018-12-04 HISTORY — PX: FOREIGN BODY REMOVAL: SHX962

## 2018-12-04 HISTORY — PX: RECONSTRUCTION OF ANGULAR DEFORMITY,TOE: SHX6564

## 2018-12-04 SURGERY — REMOVAL FOREIGN BODY EXTREMITY
Anesthesia: General | Site: Foot | Laterality: Left

## 2018-12-04 MED ORDER — CEFAZOLIN SODIUM-DEXTROSE 2-4 GM/100ML-% IV SOLN: 2.0000 g | INTRAVENOUS | Status: DC

## 2018-12-04 MED ORDER — EPHEDRINE SULFATE 50 MG/ML IJ SOLN
INTRAMUSCULAR | Status: DC | PRN
  Administered 2018-12-04: 10 mg via INTRAVENOUS
  Administered 2018-12-04: 5 mg via INTRAVENOUS
  Administered 2018-12-04: 15 mg via INTRAVENOUS
  Administered 2018-12-04 (×2): 10 mg via INTRAVENOUS

## 2018-12-04 MED ORDER — DEXAMETHASONE SODIUM PHOSPHATE 4 MG/ML IJ SOLN
INTRAMUSCULAR | Status: DC | PRN
  Administered 2018-12-04: 4 mg via INTRAVENOUS

## 2018-12-04 MED ORDER — BUPIVACAINE LIPOSOME 1.3 % IJ SUSP
INTRAMUSCULAR | Status: DC | PRN
  Administered 2018-12-04: 3.5 mL

## 2018-12-04 MED ORDER — MIDAZOLAM HCL 5 MG/5ML IJ SOLN
INTRAMUSCULAR | Status: DC | PRN
  Administered 2018-12-04: 2 mg via INTRAVENOUS

## 2018-12-04 MED ORDER — ONDANSETRON HCL 4 MG/2ML IJ SOLN
INTRAMUSCULAR | Status: DC | PRN
  Administered 2018-12-04: 4 mg via INTRAVENOUS

## 2018-12-04 MED ORDER — LIDOCAINE HCL (CARDIAC) PF 100 MG/5ML IV SOSY
PREFILLED_SYRINGE | INTRAVENOUS | Status: DC | PRN
  Administered 2018-12-04: 40 mg via INTRATRACHEAL

## 2018-12-04 MED ORDER — MEPERIDINE HCL 25 MG/ML IJ SOLN: 6.2500 mg | INTRAMUSCULAR | Status: DC | PRN

## 2018-12-04 MED ORDER — BUPIVACAINE HCL (PF) 0.25 % IJ SOLN
INTRAMUSCULAR | Status: DC | PRN
  Administered 2018-12-04: 3.5 mL

## 2018-12-04 MED ORDER — OXYCODONE HCL 5 MG PO TABS
5.0000 mg | ORAL_TABLET | Freq: Once | ORAL | Status: AC | PRN
  Administered 2018-12-04: 5 mg via ORAL

## 2018-12-04 MED ORDER — FENTANYL CITRATE (PF) 100 MCG/2ML IJ SOLN
INTRAMUSCULAR | Status: DC | PRN
  Administered 2018-12-04 (×2): 12.5 ug via INTRAVENOUS

## 2018-12-04 MED ORDER — PHENYLEPHRINE HCL (PRESSORS) 10 MG/ML IV SOLN
INTRAVENOUS | Status: DC | PRN
  Administered 2018-12-04 (×2): 50 ug via INTRAVENOUS
  Administered 2018-12-04 (×2): 100 ug via INTRAVENOUS
  Administered 2018-12-04 (×2): 50 ug via INTRAVENOUS
  Administered 2018-12-04: 100 ug via INTRAVENOUS
  Administered 2018-12-04: 50 ug via INTRAVENOUS
  Administered 2018-12-04: 100 ug via INTRAVENOUS
  Administered 2018-12-04: 50 ug via INTRAVENOUS
  Administered 2018-12-04: 100 ug via INTRAVENOUS

## 2018-12-04 MED ORDER — OXYCODONE HCL 5 MG/5ML PO SOLN: 5.0000 mg | Freq: Once | ORAL | Status: AC | PRN

## 2018-12-04 MED ORDER — BUPIVACAINE LIPOSOME 1.3 % IJ SUSP
INTRAMUSCULAR | Status: DC | PRN
  Administered 2018-12-04: 9 mL

## 2018-12-04 MED ORDER — POVIDONE-IODINE 7.5 % EX SOLN: Freq: Once | CUTANEOUS | Status: DC

## 2018-12-04 MED ORDER — LACTATED RINGERS IV SOLN
10.0000 mL/h | INTRAVENOUS | Status: DC
  Administered 2018-12-04: 10:00:00 via INTRAVENOUS

## 2018-12-04 MED ORDER — PROPOFOL 10 MG/ML IV BOLUS
INTRAVENOUS | Status: DC | PRN
  Administered 2018-12-04: 100 mg via INTRAVENOUS

## 2018-12-04 MED ORDER — HYDROCODONE-ACETAMINOPHEN 7.5-325 MG PO TABS: 1.0000 | ORAL_TABLET | Freq: Four times a day (QID) | ORAL | 0 refills | Status: DC | PRN

## 2018-12-04 MED ORDER — FENTANYL CITRATE (PF) 100 MCG/2ML IJ SOLN
25.0000 ug | INTRAMUSCULAR | Status: DC | PRN
  Administered 2018-12-04: 25 ug via INTRAVENOUS

## 2018-12-04 SURGICAL SUPPLY — 60 items
ADHESIVE MASTISOL STRL (MISCELLANEOUS) ×3 IMPLANT
BANDAGE ELASTIC 4 VELCRO NS (GAUZE/BANDAGES/DRESSINGS) ×3 IMPLANT
BIT DRILL 1.7 LNG CANN (DRILL) ×3 IMPLANT
BIT DRILL CANNULTD 2.6 X 130MM (DRILL) ×1 IMPLANT
BLADE MINI RND TIP GREEN BEAV (BLADE) ×6 IMPLANT
BLADE OSC/SAGITTAL MD 5.5X18 (BLADE) ×3 IMPLANT
BLADE OSCILLATING/SAGITTAL (BLADE) ×2
BLADE SURG 15 STRL LF DISP TIS (BLADE) ×2 IMPLANT
BLADE SURG 15 STRL SS (BLADE) ×4
BLADE SW THK.38XMED LNG THN (BLADE) ×1 IMPLANT
BNDG ESMARK 4X12 TAN STRL LF (GAUZE/BANDAGES/DRESSINGS) ×3 IMPLANT
BNDG GAUZE 4.5X4.1 6PLY STRL (MISCELLANEOUS) ×3 IMPLANT
BNDG STRETCH 4X75 STRL LF (GAUZE/BANDAGES/DRESSINGS) ×3 IMPLANT
CANISTER SUCT 1200ML W/VALVE (MISCELLANEOUS) ×3 IMPLANT
CAST PADDING 3X4FT ST 30246 (SOFTGOODS) ×4
CHLORAPREP W/TINT 26 (MISCELLANEOUS) ×3 IMPLANT
CLOSURE WOUND 1/4X4 (GAUZE/BANDAGES/DRESSINGS) ×1
CNTRSNK DRL 2 HDLS SCR (MISCELLANEOUS) ×1 IMPLANT
COUNTERSICK 4.0 HEADED (MISCELLANEOUS) ×3
COUNTERSINK 2.0 (MISCELLANEOUS) ×2
COVER LIGHT HANDLE UNIVERSAL (MISCELLANEOUS) ×6 IMPLANT
CUFF TOURN SGL QUICK 18X4 (TOURNIQUET CUFF) ×3 IMPLANT
DRAPE FLUOR MINI C-ARM 54X84 (DRAPES) ×3 IMPLANT
DRILL CANNULATED 2.6 X 130MM (DRILL) ×3
ELECT REM PT RETURN 9FT ADLT (ELECTROSURGICAL) ×3
ELECTRODE REM PT RTRN 9FT ADLT (ELECTROSURGICAL) ×1 IMPLANT
GAUZE SPONGE 4X4 12PLY STRL (GAUZE/BANDAGES/DRESSINGS) ×3 IMPLANT
GAUZE XEROFORM 1X8 LF (GAUZE/BANDAGES/DRESSINGS) ×6 IMPLANT
GLOVE BIO SURGEON STRL SZ8 (GLOVE) ×9 IMPLANT
GOWN STRL REUS W/ TWL LRG LVL3 (GOWN DISPOSABLE) ×1 IMPLANT
GOWN STRL REUS W/ TWL XL LVL3 (GOWN DISPOSABLE) ×1 IMPLANT
GOWN STRL REUS W/TWL LRG LVL3 (GOWN DISPOSABLE) ×2
GOWN STRL REUS W/TWL XL LVL3 (GOWN DISPOSABLE) ×2
K-WIRE DBL END TROCAR 6X.062 (WIRE) ×3
K-WIRE SNGL END 1.2X150 (MISCELLANEOUS) ×3
K-Wire 1.1 mm (0.045") x 152 mm (6") (Wire) ×3 IMPLANT
KIT TURNOVER KIT A (KITS) ×3 IMPLANT
KWIRE DBL END TROCAR 6X.062 (WIRE) ×1 IMPLANT
KWIRE SNGL END 1.2X150 (MISCELLANEOUS) ×1 IMPLANT
NS IRRIG 500ML POUR BTL (IV SOLUTION) ×3 IMPLANT
PACK EXTREMITY ARMC (MISCELLANEOUS) ×3 IMPLANT
PAD CAST CTTN 3X4 STRL (SOFTGOODS) ×2 IMPLANT
PADDING CAST 2X4YD ST (MISCELLANEOUS) ×4
PADDING CAST BLEND 2X4 STRL (MISCELLANEOUS) ×2 IMPLANT
PENCIL SMOKE EVACUATOR (MISCELLANEOUS) ×3 IMPLANT
RETRIEVER SUT HEWSON (MISCELLANEOUS) ×3 IMPLANT
SCREW CANNULATED 4.0X36S ×3 IMPLANT
SCREW COUNTERSINK 4.0 HEADED (MISCELLANEOUS) ×1 IMPLANT
SCREW HEADLESS 2.0X11MM (Screw) ×3 IMPLANT
SCREW HEADLESS SHRT THRD 2X10 (Screw) ×3 IMPLANT
SPLINT CAST 1 STEP 4X30 (MISCELLANEOUS) ×3 IMPLANT
SPLINT FAST PLASTER 5X30 (CAST SUPPLIES) ×2
SPLINT PLASTER CAST FAST 5X30 (CAST SUPPLIES) ×1 IMPLANT
STOCKINETTE STRL 6IN 960660 (GAUZE/BANDAGES/DRESSINGS) ×3 IMPLANT
STRIP CLOSURE SKIN 1/4X4 (GAUZE/BANDAGES/DRESSINGS) ×2 IMPLANT
SUT ORTHOCORD OS-6 NDL 36 (SUTURE) ×3 IMPLANT
SUT VIC AB 3-0 SH 27 (SUTURE) ×2
SUT VIC AB 3-0 SH 27X BRD (SUTURE) ×1 IMPLANT
SUT VIC AB 4-0 FS2 27 (SUTURE) ×6 IMPLANT
WIRE SMOOTH TROCAR .9MMX150MML (WIRE) ×3 IMPLANT

## 2018-12-04 NOTE — Op Note (Signed)
Operative note   Surgeon: Dr. Albertine Patricia, DPM.    Assistant: None    Preop diagnosis: 1 hallux malleus left foot 2.  Plantar displaced second metatarsal left foot 3.  Toe deformity second toe left foot with complication of orthopedic hardware 4.  Hammertoe deformity third toe left foot 4.  Hallux limitus left foot    Postop diagnosis: Same    Procedure:   1.  Arthrodesis IP joint left hallux   2.  Osteotomy second metatarsal left foot   3.  Removal of broken hardware from the second toe PIP joint fusion site with remodeling of the bone and joint in the region and re-pinning of the toe. 4.  Hammertoe repair third toe left foot 5.  Capsulotomy first MTP joint to try to break up scar tissue.     EBL: 15 cc    Anesthesia:general delivered by the anesthesia team.  I delivered preoperatively 10 cc of Marcaine and Exparel mixed and postoperatively 9 cc of plain Exparel at the base the operative sites.    Hemostasis: Ankle tourniquet 225 mils mercury pressure initially for 75 minutes was released for 10 minutes and then reinflated for another 34 minutes.    Specimen: None    Complications: No specific complications just difficulty with procedure due to significant rotational deformities of the digits.    Operative indications: Chronic discomfort and pain and deformity secondary to visual problems and foot problems in general on the left foot.  Complications from previous surgery.  Resistant to conservative care.    Procedure:  Patient was brought into the OR and placed on the operating table in thesupine position. After anesthesia was obtained theleft lower extremity was prepped and draped in usual sterile fashion.  Operative Report: This time attention directed to the left great toe and metatarsophalangeal joint.  A linear portion of incision was made extending over the first MTP joint extending up onto the great toe.  PIP joint is third and 2 semielliptical incisions transversely and  ellipse skin was then removed.  Incision was deepened over the PIP joint extensor tendon was released and reflected proximally and dorsally.  At this point the articular cartilage was removed from both sides of the joint there was some arthritic changes noted.  A K wire was drilled through the distal phalanx distally in a retrograde in the proximal phalanx.  The fusion site was then closed down with care to remove the soft tissue interposition across the region.  There is a portion of the position and correction were noted at this timeframe.  This point a 34 mm 4.0 cannulated screw was placed across the area good compression was noted across the region good alignment of the digit.  This time is noted however that the first MTP joint had significantly limited range of motion so I felt like it was best to try to break up some of the scar tissue along the joint.  This was accomplished by sharp release of scar tissue around the joint capsule and freed that area up.  Much better range of motion was accomplished once this was achieved.  All areas and copiously irrigated and capsule tissue was closed with 3-0 Vicryl in continuous stitch.  A screw across the medial aspect of the proximal phalanx had been removed earlier prior to inserting the intramedullary screw.  Made capsule tissue was closed with a 3-0 Vicryl continuous stitches extensor tendon over the DIP joint was closed with 3-0 Vicryl and a simple interrupted fashion  multiple sutures were utilized.  Skin proximally only longitudinal incision was closed with 3-0 Vicryl subcuticular stitch.  Skin was closed over the transverse incision with 4-0 nylon simple running horizontal mattress combination.  This time attention was directed to the second metatarsal phalangeal joint and second toe a 5 cm linear incision was made starting over the second metatarsal and carried over the MTP joint extending over the PIP joint.  The extensor tendon was freed up there is  extensive scar tissue around that tendon.  This was tracked laterally and a incision was made through the capsule periosteal tissue overlying the second metatarsal distal shaft and head.  This freed medial laterally and at this point osteotomy performed in the metatarsal from dorsal distal plantar proximal oblique fashion.  The head of the second metatarsal was then transposed to a more medial l position and temporarily fixated checked for single position correction were noted.  2.0 screws were placed across the area from the [employer] mini monster set. Attention was now directed to the second toe PIP joint where the earlier been fused with a hammerlock hammertoe implant unfortunately the implant had broken.  I did make a cut through the area where the PIP joint was partially fused.  I was able to locate the proximal portion of the implant plantar to the proximal phalanx head and I was able to remove that.  The distal fragments of the implant I was able remove the medial compartment but not the lateral compartment.  This some extra bone was taken off of the middle phalanx component try to get some Rall bone and a 4 5 K wire was drilled through the middle distal phalanges and retrograded the proximal phalanx.  This was difficult to get him because of the frontal plane deformity rotation of the digit but I felt like we got a pretty good result of the long run.  I also ran the K wire across the metatarsal phalangeal joint for stability and good position performed.  This point all areas were copiously irrigated extensor tendon over the PIP joint was sutured back with 4-0 Vicryl simple erupted sutures.  Deep superficial fascial layers including capsular tissue over the metatarsal were then closed with 4-0 Vicryl continuous stitch.  Skin was closed with 4-0 Vicryl subcuticular stitch.  This time to inspect of the third toe of the left foot where a longitudinal incision was made over the DIP joint.  This was deepened  sharp blunt dissection extension was identified incised transversely reflected proximally.  The head of the middle phalanx was then resected with power equipment.  There is an copiously irrigated and the extensor tendon was reapproximated with 3-0 Vicryl simple erupted sutures.  Skin was closed with 4-0 nylon simple sutures.  This time all areas were blocked with Exparel a sterile compressive dressing was placed across the wounds consisting of Steri-Strips Xeroform gauze 4 x 4's Kling and Kerlix.  A posterior splint was placed on the left foot leg in the operating room.    Patient tolerated the procedure and anesthesia well.  Was transported from the OR to the PACU with all vital signs stable and vascular status intact. To be discharged per routine protocol.  Will follow up in approximately 1 week in the outpatient clinic.

## 2018-12-04 NOTE — Anesthesia Procedure Notes (Signed)
Procedure Name: LMA Insertion Date/Time: 12/04/2018 9:49 AM Performed by: Janna Arch, CRNA Pre-anesthesia Checklist: Patient identified, Emergency Drugs available, Suction available, Timeout performed and Patient being monitored Patient Re-evaluated:Patient Re-evaluated prior to induction Oxygen Delivery Method: Circle system utilized Preoxygenation: Pre-oxygenation with 100% oxygen Induction Type: IV induction LMA: LMA inserted LMA Size: 4.0 Number of attempts: 1 Placement Confirmation: positive ETCO2 and breath sounds checked- equal and bilateral Tube secured with: Tape

## 2018-12-04 NOTE — Anesthesia Postprocedure Evaluation (Signed)
Anesthesia Post Note  Patient: Tracey Wilson  Procedure(s) Performed: F/B REMOVAL, COMPLICATED (Left Foot) RECONSTRUCTION OVERLAPPING TOE LEFT (Left Foot)  Patient location during evaluation: PACU Anesthesia Type: General Level of consciousness: awake and alert Pain management: pain level controlled Vital Signs Assessment: post-procedure vital signs reviewed and stable Respiratory status: spontaneous breathing, nonlabored ventilation, respiratory function stable and patient connected to nasal cannula oxygen Cardiovascular status: blood pressure returned to baseline and stable Postop Assessment: no apparent nausea or vomiting Anesthetic complications: no    Alisa Graff

## 2018-12-04 NOTE — Transfer of Care (Signed)
Immediate Anesthesia Transfer of Care Note  Patient: Tracey Wilson  Procedure(s) Performed: F/B REMOVAL, COMPLICATED (Left Foot) RECONSTRUCTION OVERLAPPING TOE LEFT (Left Foot)  Patient Location: PACU  Anesthesia Type: General  Level of Consciousness: awake, alert  and patient cooperative  Airway and Oxygen Therapy: Patient Spontanous Breathing and Patient connected to supplemental oxygen  Post-op Assessment: Post-op Vital signs reviewed, Patient's Cardiovascular Status Stable, Respiratory Function Stable, Patent Airway and No signs of Nausea or vomiting  Post-op Vital Signs: Reviewed and stable  Complications: No apparent anesthesia complications

## 2018-12-04 NOTE — H&P (Signed)
H and P has been reviewed and no changes are noted.  

## 2018-12-04 NOTE — Anesthesia Preprocedure Evaluation (Signed)
Anesthesia Evaluation  Patient identified by MRN, date of birth, ID band Patient awake    Reviewed: Allergy & Precautions, H&P , NPO status , Patient's Chart, lab work & pertinent test results, reviewed documented beta blocker date and time   Airway Mallampati: II  TM Distance: >3 FB Neck ROM: full    Dental no notable dental hx.    Pulmonary neg pulmonary ROS,    Pulmonary exam normal breath sounds clear to auscultation       Cardiovascular Exercise Tolerance: Good hypertension, negative cardio ROS   Rhythm:regular Rate:Normal     Neuro/Psych Bipolar Disorder Peripheral neuropathy  Neuromuscular disease    GI/Hepatic negative GI ROS, Neg liver ROS,   Endo/Other  negative endocrine ROS  Renal/GU CRFRenal disease  negative genitourinary   Musculoskeletal   Abdominal   Peds  Hematology negative hematology ROS (+)   Anesthesia Other Findings   Reproductive/Obstetrics negative OB ROS                             Anesthesia Physical Anesthesia Plan  ASA: II  Anesthesia Plan: General   Post-op Pain Management:    Induction:   PONV Risk Score and Plan:   Airway Management Planned:   Additional Equipment:   Intra-op Plan:   Post-operative Plan:   Informed Consent: I have reviewed the patients History and Physical, chart, labs and discussed the procedure including the risks, benefits and alternatives for the proposed anesthesia with the patient or authorized representative who has indicated his/her understanding and acceptance.     Dental Advisory Given  Plan Discussed with: CRNA  Anesthesia Plan Comments:         Anesthesia Quick Evaluation

## 2018-12-05 ENCOUNTER — Encounter: Payer: Self-pay | Admitting: Podiatry

## 2018-12-08 ENCOUNTER — Ambulatory Visit
Admission: RE | Admit: 2018-12-08 | Discharge: 2018-12-08 | Disposition: A | Discharge status: Home / Self Care | Payer: Medicare Other | Source: Ambulatory Visit | Attending: Family Medicine | Admitting: Family Medicine

## 2018-12-08 ENCOUNTER — Other Ambulatory Visit: Payer: Self-pay

## 2018-12-08 DIAGNOSIS — Z1231 Encounter for screening mammogram for malignant neoplasm of breast: Secondary | ICD-10-CM | POA: Diagnosis not present

## 2018-12-08 LAB — NOVEL CORONAVIRUS, NAA (HOSP ORDER, SEND-OUT TO REF LAB; TAT 18-24 HRS): SARS-CoV-2, NAA: NOT DETECTED

## 2019-06-10 ENCOUNTER — Other Ambulatory Visit: Payer: Self-pay | Admitting: Obstetrics and Gynecology

## 2019-06-10 DIAGNOSIS — Z1231 Encounter for screening mammogram for malignant neoplasm of breast: Secondary | ICD-10-CM

## 2019-09-16 DIAGNOSIS — R251 Tremor, unspecified: Secondary | ICD-10-CM | POA: Insufficient documentation

## 2019-12-09 ENCOUNTER — Ambulatory Visit: Payer: Medicare Other

## 2019-12-10 ENCOUNTER — Ambulatory Visit
Admission: RE | Admit: 2019-12-10 | Discharge: 2019-12-10 | Disposition: A | Discharge status: Home / Self Care | Payer: Medicare Other | Source: Ambulatory Visit | Attending: Obstetrics and Gynecology | Admitting: Obstetrics and Gynecology

## 2019-12-10 ENCOUNTER — Other Ambulatory Visit: Payer: Self-pay

## 2019-12-10 DIAGNOSIS — Z1231 Encounter for screening mammogram for malignant neoplasm of breast: Secondary | ICD-10-CM | POA: Insufficient documentation

## 2020-01-04 ENCOUNTER — Encounter: Payer: Self-pay | Admitting: Emergency Medicine

## 2020-01-04 ENCOUNTER — Ambulatory Visit
Admission: EM | Admit: 2020-01-04 | Discharge: 2020-01-04 | Disposition: A | Discharge status: Home / Self Care | Payer: Medicare Other | Attending: Emergency Medicine | Admitting: Emergency Medicine

## 2020-01-04 ENCOUNTER — Other Ambulatory Visit: Payer: Self-pay

## 2020-01-04 DIAGNOSIS — N390 Urinary tract infection, site not specified: Secondary | ICD-10-CM | POA: Diagnosis not present

## 2020-01-04 DIAGNOSIS — R509 Fever, unspecified: Secondary | ICD-10-CM | POA: Insufficient documentation

## 2020-01-04 DIAGNOSIS — R531 Weakness: Secondary | ICD-10-CM | POA: Diagnosis present

## 2020-01-04 DIAGNOSIS — R319 Hematuria, unspecified: Secondary | ICD-10-CM | POA: Insufficient documentation

## 2020-01-04 DIAGNOSIS — R109 Unspecified abdominal pain: Secondary | ICD-10-CM | POA: Insufficient documentation

## 2020-01-04 LAB — URINALYSIS, COMPLETE (UACMP) WITH MICROSCOPIC
Bilirubin Urine: NEGATIVE
Glucose, UA: NEGATIVE mg/dL
Nitrite: POSITIVE — AB
Protein, ur: 30 mg/dL — AB
Specific Gravity, Urine: 1.01 (ref 1.005–1.030)
pH: 7 (ref 5.0–8.0)

## 2020-01-04 LAB — GLUCOSE, CAPILLARY: Glucose-Capillary: 121 mg/dL — ABNORMAL HIGH (ref 70–99)

## 2020-01-04 MED ORDER — ACETAMINOPHEN 500 MG PO TABS
1000.0000 mg | ORAL_TABLET | Freq: Once | ORAL | Status: AC
  Administered 2020-01-04: 1000 mg via ORAL

## 2020-01-04 NOTE — Discharge Instructions (Addendum)
Go directly to the emergency room as discussed.  °

## 2020-01-04 NOTE — ED Provider Notes (Addendum)
[facility] ____________________________________________  Time seen: Approximately 8:49 PM  I have reviewed the triage vital signs and the nursing notes.   HISTORY  Chief Complaint Dysuria and Altered Mental Status   HPI Tracey Wilson is a 74 y.o. female history of bipolar, renal insufficiency, hypertension, transient confusion, presenting with son at bedside for urinary frequency, urinary discomfort, chills and weakness.  Son reports they have noticed weakness over the last week with several falls in home.  Denies head injuries with these falls.  States she normally does not use a walker but has been using a walker to get around.  Does report she has been confused, but reports it is difficult for him to say if this is worse than then her baseline or not.  Reports in the last day she has been complaining of chills.  Denies vomiting.  States she has been very thirsty.  Patient denies pain at this time.  No medication given over-the-counter just prior to arrival.  Some coughing today.  Denies known sick contacts.  [name], MD : PCP   Past Medical History:  Diagnosis Date  . Arthritis    osteo, multiple sites  . Bipolar 1 disorder, mixed ([place])   . Cancer ([place])    skin-malignant neoplasm,scc  . Chronic kidney disease   . Cyst of ovary   . Dysrhythmia    left ventricular systolic dysfunction  . HOH (hard of hearing)    right ear  . Hyperlipidemia   . Hypertension   . Lordosis of lumbar region    kyphosis  . Peripheral neuropathy    balls of feet  . RBBB (right bundle branch block)    Dr Gareth Eagle [phone]  . Serum lipids high   . UTI (urinary tract infection)    June at [facility] ER  . Vertigo    dental office chairs    There are no problems to display for this patient.   Past Surgical History:  Procedure Laterality Date  . BREAST BIOPSY Right years ago   2 bx. done. Negative results, benign lumpectomy  . COLONOSCOPY  [id number]    diverticulosis  . COLONOSCOPY WITH PROPOFOL N/A 09/26/2017   Procedure: COLONOSCOPY WITH PROPOFOL;  Surgeon: Lollie Sails, MD;  Location: [facility] ENDOSCOPY;  Service: Endoscopy;  Laterality: N/A;  . EYE SURGERY Bilateral [id number]   cataract, right then left  . FOOT SURGERY Right 2014   bunionectomy with hammertoe  . FOREIGN BODY REMOVAL Left 12/04/2018   Procedure: F/B REMOVAL, COMPLICATED;  Surgeon: Albertine Patricia, DPM;  Location: [place];  Service: Podiatry;  Laterality: Left;   removed hammerlock implant and screw x 1  . HAMMER TOE SURGERY Left 04/27/2015   Procedure: HAMMER TOE CORRECTION 2ND TOE;  Surgeon: [name], DPM;  Location: [place];  Service: Podiatry;  Laterality: Left;  . METATARSAL OSTEOTOMY Left 04/27/2015   Procedure: METATARSAL OSTEOTOMY 1ST METATARSAL AND Proximal phalanx osteotomy.;  Surgeon: [name], DPM;  Location: [place];  Service: Podiatry;  Laterality: Left;  . MOHS SURGERY  07/10/2018   face and right forearm  . RECONSTRUCTION OF ANGULAR DEFORMITY,TOE Left 12/04/2018   Procedure: RECONSTRUCTION OVERLAPPING TOE LEFT;  Surgeon: Albertine Patricia, DPM;  Location: [place];  Service: Podiatry;  Laterality: Left;  . TUBAL LIGATION       No current facility-administered medications for this encounter.  Current Outpatient Medications:  .  Artificial Saliva (BIOTENE MOISTURIZING MOUTH MT), Use as directed in  the mouth or throat as needed., Disp: , Rfl:  .  aspirin 81 MG tablet, Take 81 mg by mouth at bedtime., Disp: , Rfl:  .  Coenzyme Q10 (CO Q10) 100 MG CAPS, Take 200 mg by mouth daily., Disp: , Rfl:  .  divalproex (DEPAKOTE ER) 500 MG 24 hr tablet, Take by mouth at bedtime., Disp: , Rfl:  .  Flaxseed, Linseed, (FLAXSEED OIL PO), Take by mouth daily., Disp: , Rfl:  .  gabapentin (NEURONTIN) 100 MG capsule, Take 600 mg by mouth at bedtime. , Disp: , Rfl:  .  Garlic 8841 MG CAPS, Take 1 capsule by mouth.  Breakfast, Disp: , Rfl:  .  imipramine (TOFRANIL) 25 MG tablet, Take 25 mg by mouth at bedtime., Disp: , Rfl:  .  KRILL OIL PO, Take by mouth daily., Disp: , Rfl:  .  MAGNESIUM CITRATE PO, Take 250 mg by mouth 2 (two) times daily. Breakfast and pm, Disp: , Rfl:  .  oxybutynin (DITROPAN-XL) 5 MG 24 hr tablet, Take 5 mg by mouth at bedtime., Disp: , Rfl:  .  pravastatin (PRAVACHOL) 40 MG tablet, Take 40 mg by mouth at bedtime., Disp: , Rfl:  .  QUEtiapine (SEROQUEL) 50 MG tablet, Take 50 mg by mouth at bedtime., Disp: , Rfl:  .  HYDROcodone-acetaminophen (NORCO) 7.5-325 MG tablet, Take 1 tablet by mouth every 6 (six) hours as needed for moderate pain., Disp: 30 tablet, Rfl: 0 .  losartan (COZAAR) 25 MG tablet, Take 25 mg by mouth daily., Disp: , Rfl:  .  sodium fluoride (PREVIDENT 5000 DRY MOUTH) 1.1 % GEL dental gel, Place 1 application onto teeth at bedtime., Disp: , Rfl:  .  TURMERIC PO, Take by mouth daily. Turm-ging-bos-yuc-wil-cham-hor 100-100-100-125 tab, Disp: , Rfl:   Allergies Other, Shellfish-derived products, and Adhesive [tape]  Family History  Problem Relation Age of Onset  . Breast cancer Sister 83    Social History Social History   Tobacco Use  . Smoking status: Never Smoker  . Smokeless tobacco: Never Used  Vaping Use  . Vaping Use: Never used  Substance Use Topics  . Alcohol use: Yes    Comment: 1 drink/week  . Drug use: Not on file    Review of Systems Constitutional: No fever Cardiovascular: Denies chest pain. Respiratory: Denies shortness of breath. Gastrointestinal: No abdominal pain.  No nausea, no vomiting.  No diarrhea. Genitourinary: Positive for dysuria. Musculoskeletal: Negative for back pain. Skin: Negative for rash. Neurological: Negative for numbness.    ____________________________________________   PHYSICAL EXAM:  VITAL SIGNS: ED Triage Vitals  Enc Vitals Group     BP 01/04/20 1945 124/73     Pulse Rate 01/04/20 1945 99     Resp  01/04/20 1945 18     Temp 01/04/20 1945 (!) 101.6 F (38.7 C)     Temp Source 01/04/20 1945 Oral     SpO2 01/04/20 1945 96 %     Weight 01/04/20 1941 134 lb 14.7 oz (61.2 kg)     Height 01/04/20 1941 5' 1.5" (1.562 m)     Head Circumference --      Peak Flow --      Pain Score 01/04/20 1941 2     Pain Loc --      Pain Edu? --      Excl. in [place]? --     Constitutional: Alert.  ENT      Head: Normocephalic and atraumatic. Cardiovascular: Normal rate, regular rhythm. Grossly  normal heart sounds.  Good peripheral circulation. Respiratory: Normal respiratory effort without tachypnea nor retractions. Breath sounds are clear and equal bilaterally. No wheezes, rales, rhonchi. Gastrointestinal: Soft and nontender.  Positive left CVA tenderness.  No right CVA tenderness. Neurologic:  Normal speech and language.  Alert and oriented to person, place.  Disoriented to situation. Skin:  Skin is warm, dry and intact. No rash noted. Psychiatric: Mood and affect are normal. Speech and behavior are normal. Patient exhibits appropriate insight and judgment   ___________________________________________   LABS (all labs ordered are listed, but only abnormal results are displayed)  Labs Reviewed  URINALYSIS, COMPLETE (UACMP) WITH MICROSCOPIC - Abnormal; Notable for the following components:      Result Value   APPearance HAZY (*)    Hgb urine dipstick MODERATE (*)    Ketones, ur TRACE (*)    Protein, ur 30 (*)    Nitrite POSITIVE (*)    Leukocytes,Ua LARGE (*)    Bacteria, UA MANY (*)    All other components within normal limits  GLUCOSE, CAPILLARY - Abnormal; Notable for the following components:   Glucose-Capillary 121 (*)    All other components within normal limits  URINE CULTURE    PROCEDURES Procedures    INITIAL IMPRESSION / ASSESSMENT AND PLAN / ED COURSE  Pertinent labs & imaging results that were available during my care of the patient were reviewed by me and considered in my  medical decision making (see chart for details).  Patient presenting with son at bedside for the above complaints.  Urinalysis positive UTI.  Patient febrile.  Also with left CVA tenderness.  Concern for UTI versus pyelonephritis with possible sepsis.  Recommend further evaluation emergency room at this time.  Son states he will take patient directly to [facility], patient and son agreed to this plan.  Directed to go directly to the emergency room and remain n.p.o. at this time.  ____________________________________________   FINAL CLINICAL IMPRESSION(S) / ED DIAGNOSES  Final diagnoses:  Urinary tract infection with hematuria, site unspecified  Fever, unspecified  Left flank pain  Weakness     ED Discharge Orders    None       Note: This dictation was prepared with Dragon dictation along with smaller phrase technology. Any transcriptional errors that result from this process are unintentional.         Marylene Land, NP 01/04/20 2123

## 2020-01-04 NOTE — ED Triage Notes (Signed)
Pt son states she has been falling the last few days weak  and confusion. Started last night. He also states the walker is not something she normally uses.

## 2020-01-05 DIAGNOSIS — E119 Type 2 diabetes mellitus without complications: Secondary | ICD-10-CM | POA: Insufficient documentation

## 2020-01-05 DIAGNOSIS — R296 Repeated falls: Secondary | ICD-10-CM | POA: Insufficient documentation

## 2020-01-05 DIAGNOSIS — W19XXXA Unspecified fall, initial encounter: Secondary | ICD-10-CM | POA: Insufficient documentation

## 2020-01-07 ENCOUNTER — Other Ambulatory Visit: Payer: Self-pay | Admitting: Obstetrics and Gynecology

## 2020-01-07 ENCOUNTER — Telehealth (HOSPITAL_COMMUNITY): Payer: Self-pay

## 2020-01-07 DIAGNOSIS — N390 Urinary tract infection, site not specified: Secondary | ICD-10-CM

## 2020-01-07 LAB — URINE CULTURE: Culture: 50000 — AB

## 2020-01-07 MED ORDER — SULFAMETHOXAZOLE-TRIMETHOPRIM 800-160 MG PO TABS: 1.0000 | ORAL_TABLET | Freq: Two times a day (BID) | ORAL | 0 refills | Status: AC

## 2020-01-25 NOTE — H&P (Signed)
Tracey Wilson is a 74 y.o. female here for L/S BSO  . Pt is here to discuss abnormal HE-4 that was done in conjunction with serial u/s for a complex left ovarian cyst  HE4 - [facility] 0.0 - 96.9 pmol/L 131.0High     Cancer Antigen (CA) 125 - [facility] 0.0 - 38.1 U/mL 18.4     U/s :  Lt post fibroid= 18 mm   Endometrium= 4.97 mm  Rt ov wnl   Lt ov Complex cyst= 4.11 cm Septation=0.19 cm  Past Medical History:  has a past medical history of Abnormal EKG, Arthritis, Bipolar disorder (CMS-HCC), Cataract cortical, senile, Diverticulosis, Hyperlipidemia, Idiopathic peripheral neuropathy (06/01/2015), Tremor, Urinary incontinence, mixed (06/01/2015), and Valvular heart disease.  Past Surgical History:  has a past surgical history that includes Tubal ligation; Colonoscopy (2010); Cataract extraction; Colonoscopy (2013); and Colonoscopy (09/26/2017). Family History: family history includes Bipolar disorder in her mother; Breast cancer in an other family member; Leukemia in her mother; Rheum arthritis in her father and mother; Stroke in her father. Social History:  reports that she has never smoked. She has never used smokeless tobacco. She reports current alcohol use of about 1.0 standard drinks of alcohol per week. OB/GYN History:          OB History    Gravida  2   Para  2   Term  2   Preterm      AB      Living  2     SAB      TAB      Ectopic      Molar      Multiple      Live Births  2          Allergies: is allergic to latex, natural rubber; mango; other; others; adhesive tape-silicones; and shellfish derived. Medications:  Current Outpatient Medications:  .  BIOTIN ORAL, Take 1 capsule by mouth once daily   , Disp: , Rfl:  .  co-enzyme Q-10, ubiquinone, 100 mg capsule, Take 200 mg by mouth once daily   , Disp: , Rfl:  .  DAILY GARLIC ONCE-A-DAY ORAL, Take 1,000 mg by mouth once daily   , Disp: , Rfl:  .  divalproex (DEPAKOTE ER) 500 MG ER tablet,  TAKE 1 TABLET BY MOUTH ONCE DAILY, Disp: 90 tablet, Rfl: 3 .  flaxseed oil Oil, Take 1,000 mg by mouth once daily   , Disp: , Rfl:  .  gabapentin (NEURONTIN) 600 MG tablet, Take 1 tablet (600 mg total) by mouth nightly, Disp: 90 tablet, Rfl: 3 .  imipramine (TOFRANIL) 25 MG tablet, Take 25 mg by mouth nightly, Disp: , Rfl:  .  Lactobacillus acidophilus (ACIDOPHILUS) Cap, Take by mouth, Disp: , Rfl:  .  losartan (COZAAR) 25 MG tablet, Take 25 mg by mouth once daily, Disp: , Rfl:  .  MAGNESIUM CITRATE ORAL, Take 1 capsule by mouth once daily   , Disp: , Rfl:  .  oxybutynin (DITROPAN-XL) 5 MG XL tablet, Take 1 tablet (5 mg total) by mouth once daily, Disp: 90 tablet, Rfl: 3 .  pravastatin (PRAVACHOL) 40 MG tablet, TAKE 1 TABLET BY MOUTH ONCE DAILY, Disp: 90 tablet, Rfl: 3 .  PREVIDENT 5000 DRY MOUTH 1.1 % gel, Place onto teeth nightly   , Disp: , Rfl: 3 .  QUEtiapine (SEROQUEL) 50 MG tablet, Take 1 tablet (50 mg total) by mouth nightly, Disp: 90 tablet, Rfl: 3 .  turm-ging-bos-yuc-wil-cham-hor 100-100-100-125 mg Tab,  Take 1 capsule by mouth once daily., Disp: , Rfl:   Review of Systems: General:                      No fatigue or weight loss Eyes:                           No vision changes Ears:                            No hearing difficulty Respiratory:                No cough or shortness of breath Pulmonary:                  No asthma or shortness of breath Cardiovascular:           No chest pain, palpitations, dyspnea on exertion Gastrointestinal:          No abdominal bloating, chronic diarrhea, constipations, masses, pain or hematochezia Genitourinary:             No hematuria, dysuria, abnormal vaginal discharge, pelvic pain, Menometrorrhagia Lymphatic:                   No swollen lymph nodes Musculoskeletal:         No muscle weakness Neurologic:                  No extremity weakness, syncope, seizure disorder Psychiatric:                  No history of depression, delusions or  suicidal/homicidal ideation    Exam:      Vitals:   12/30/19 1444  BP: 161/81    Body mass index is 24.75 kg/m.  WDWN white female in NAD   Lungs: CTA  CV : RRR without murmur   Breast: exam done in sitting and lying position : No dimpling or retraction, no dominant mass, no spontaneous discharge, no axillary adenopathy Neck:  no thyromegaly Abdomen: soft , no mass, normal active bowel sounds,  non-tender, no rebound tenderness Pelvic: tanner stage 5 ,  External genitalia: vulva /labia no lesions Urethra: no prolapse Vagina: normal physiologic d/c Cervix: no lesions, no cervical motion tenderness   Uterus: normal size shape and contour, non-tender Adnexa: no mass,  non-tender   Rectovaginal:   Impression:   The encounter diagnosis was Left ovarian cyst.  Complex ovarian cyst  with abnormal HE-4   Plan:  Spoke to her about L/S BSO now that her cancer antigen test is abnormal . I have explained the procedure to her in detail .  Benefits and risks to surgery:

## 2020-01-25 NOTE — H&P (Signed)
Tracey Wilson is a 74 y.o. female here foL/S BSO for complex left ovarian cyst and elevated HE-4 HE4 - [facility] 0.0 - 96.9 pmol/L 131.0High     Cancer Antigen (CA) 125 - [facility] 0.0 - 38.1 U/mL 18.4     U/s :  Lt post fibroid= 18 mm   Endometrium= 4.97 mm  Rt ov wnl   Lt ov Complex cyst= 4.11 cm Septation=0.19 cm  Past Medical History:  has a past medical history of Abnormal EKG, Arthritis, Bipolar disorder (CMS-HCC), Cataract cortical, senile, Diverticulosis, Hyperlipidemia, Idiopathic peripheral neuropathy (06/01/2015), Tremor, Urinary incontinence, mixed (06/01/2015), and Valvular heart disease.  Past Surgical History:  has a past surgical history that includes Tubal ligation; Colonoscopy (2010); Cataract extraction; Colonoscopy (2013); and Colonoscopy (09/26/2017). Family History: family history includes Bipolar disorder in her mother; Breast cancer in an other family member; Leukemia in her mother; Rheum arthritis in her father and mother; Stroke in her father. Social History:  reports that she has never smoked. She has never used smokeless tobacco. She reports current alcohol use of about 1.0 standard drinks of alcohol per week. OB/GYN History:          OB History    Gravida  2   Para  2   Term  2   Preterm      AB      Living  2     SAB      TAB      Ectopic      Molar      Multiple      Live Births  2          Allergies: is allergic to latex, natural rubber; mango; other; others; adhesive tape-silicones; and shellfish derived. Medications:  Current Outpatient Medications:  .  BIOTIN ORAL, Take 1 capsule by mouth once daily   , Disp: , Rfl:  .  co-enzyme Q-10, ubiquinone, 100 mg capsule, Take 200 mg by mouth once daily   , Disp: , Rfl:  .  DAILY GARLIC ONCE-A-DAY ORAL, Take 1,000 mg by mouth once daily   , Disp: , Rfl:  .  divalproex (DEPAKOTE ER) 500 MG ER tablet, TAKE 1 TABLET BY MOUTH ONCE DAILY, Disp: 90 tablet, Rfl: 3 .  flaxseed  oil Oil, Take 1,000 mg by mouth once daily   , Disp: , Rfl:  .  gabapentin (NEURONTIN) 600 MG tablet, Take 1 tablet (600 mg total) by mouth nightly, Disp: 90 tablet, Rfl: 3 .  imipramine (TOFRANIL) 25 MG tablet, Take 25 mg by mouth nightly, Disp: , Rfl:  .  Lactobacillus acidophilus (ACIDOPHILUS) Cap, Take by mouth, Disp: , Rfl:  .  losartan (COZAAR) 25 MG tablet, Take 25 mg by mouth once daily, Disp: , Rfl:  .  MAGNESIUM CITRATE ORAL, Take 1 capsule by mouth once daily   , Disp: , Rfl:  .  oxybutynin (DITROPAN-XL) 5 MG XL tablet, Take 1 tablet (5 mg total) by mouth once daily, Disp: 90 tablet, Rfl: 3 .  pravastatin (PRAVACHOL) 40 MG tablet, TAKE 1 TABLET BY MOUTH ONCE DAILY, Disp: 90 tablet, Rfl: 3 .  PREVIDENT 5000 DRY MOUTH 1.1 % gel, Place onto teeth nightly   , Disp: , Rfl: 3 .  QUEtiapine (SEROQUEL) 50 MG tablet, Take 1 tablet (50 mg total) by mouth nightly, Disp: 90 tablet, Rfl: 3 .  turm-ging-bos-yuc-wil-cham-hor 100-100-100-125 mg Tab, Take 1 capsule by mouth once daily., Disp: , Rfl:   Review of Systems: General:  No fatigue or weight loss Eyes:                           No vision changes Ears:                            No hearing difficulty Respiratory:                No cough or shortness of breath Pulmonary:                  No asthma or shortness of breath Cardiovascular:           No chest pain, palpitations, dyspnea on exertion Gastrointestinal:          No abdominal bloating, chronic diarrhea, constipations, masses, pain or hematochezia Genitourinary:             No hematuria, dysuria, abnormal vaginal discharge, pelvic pain, Menometrorrhagia Lymphatic:                   No swollen lymph nodes Musculoskeletal:         No muscle weakness Neurologic:                  No extremity weakness, syncope, seizure disorder Psychiatric:                  No history of depression, delusions or suicidal/homicidal ideation    Exam:      Vitals:   007/27/21  1444  BP: 161/81    Body mass index is 24.75 kg/m.  WDWN white female in NAD   Lungs: CTA  CV : RRR without murmur   Breast: exam done in sitting and lying position : No dimpling or retraction, no dominant mass, no spontaneous discharge, no axillary adenopathy Neck:  no thyromegaly Abdomen: soft , no mass, normal active bowel sounds,  non-tender, no rebound tenderness Pelvic: tanner stage 5 ,  External genitalia: vulva /labia no lesions Urethra: no prolapse Vagina: normal physiologic d/c Cervix: no lesions, no cervical motion tenderness   Uterus: normal size shape and contour, non-tender Adnexa: no mass,  non-tender   Rectovaginal:   Impression:   The encounter diagnosis was Left ovarian cyst.  Complex ovarian cyst  with abnormal HE-4 , risks of early ovarian cancer  Plan:  Spoke to her about L/S BSO now that her cancer antigen test is abnormal . I have explained the procedure to her in detail .  Benefits and risks to surgery: The proposed benefit of the surgery has been discussed with the patient. The possible risks include, but are not limited to: organ injury to the bowel , bladder, ureters, and major blood vessels and nerves. There is a possibility of additional surgeries resulting from these injuries. There is also the risk of blood transfusion and the need to receive blood products during or after the procedure which may rarely lead to HIV or Hepatitis C infection. There is a risk of developing a deep venous thrombosis or a pulmonary embolism . There is the possibility of wound infection and also anesthetic complications, even the rare possibility of death. The patient understands these risks and wishes to proceed. All questions have been answered       Caroline Sauger, MD

## 2020-02-01 ENCOUNTER — Other Ambulatory Visit
Admission: RE | Admit: 2020-02-01 | Discharge: 2020-02-01 | Disposition: A | Discharge status: Home / Self Care | Payer: Medicare Other | Source: Ambulatory Visit | Attending: Obstetrics and Gynecology | Admitting: Obstetrics and Gynecology

## 2020-02-01 NOTE — Pre-Procedure Instructions (Signed)
ECG 12 Lead   Ref Range & Units 3 wk ago  EKG Systolic BP mmHg      EKG Diastolic BP mmHg      EKG Ventricular Rate BPM 84      EKG Atrial Rate BPM 84      EKG P-R Interval ms 150      EKG QRS Duration ms 142      EKG Q-T Interval ms 378      EKG QTC Calculation ms 446      EKG Calculated P Axis degrees 40      EKG Calculated R Axis degrees -76      EKG Calculated T Axis degrees 52      QTC Fredericia ms 422      Resulting Agency  [facility]  Narrative Performed by [facility] NORMAL SINUS RHYTHM  RIGHT BUNDLE BRANCH BLOCK  LEFT ANTERIOR FASCICULAR BLOCK  NO PREVIOUS ECGS AVAILABLE  Confirmed by Tor Netters [id number]) on 01/10/2020 7:03:19 PM Procedure Note  Interface, Rad Results In - 01/10/2020 7:03 PM EDT  Formatting of this note might be different from the original.  [place]  LEFT ANTERIOR FASCICULAR BLOCK  NO PREVIOUS ECGS AVAILABLE  Confirmed by Tor Netters [phone]) on 01/10/2020 7:03:19 PM Specimen Collected: 01/05/20 8:18 AM Last Resulted: 01/10/20 7:03 PM  Received From: [facility]  Result Received: 01/19/20 3:52 PM

## 2020-02-01 NOTE — Pre-Procedure Instructions (Signed)
Progress Notes - documented in this encounter Tracey Dibble, Tracey Wilson - 10/15/2018 1:45 PM EDT Formatting of this note might be different from the original. Established Patient Visit   Chief Complaint: Chief Complaint  Patient presents with  . Fall  Date of Service: 10/15/2018 Date of Birth: Jan 20, 1946 PCP: Loa Socks., Tracey Wilson  History of Present Illness: Tracey Wilson is a 74 y.o.female patient  Shortness of breath The patient presents with chronic shortness of breath stable over the last 5 weeks which occurs with moderate exertion but does not limits ADLs associated with walking fast and relived by rest and lasting intermittent (<1 minute). Other related symptoms include foot pain. 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 Currently it is Chronic kidney disease stage 3. The kidney disease is also associated with current cardiovascular disease process and risk factors including age, postmenopausal female and HTN. She denies chest pain, orthopnea, paroxysmal nocturnal dyspnea and lower extremity edema.. She reports adherence to medications. Hypertensive heart disease The patient has been on a medical regimen of antihypertensives for hypertensive heart disease as well as risk reduction of chronic kidney disease and valve disease and is improved at this time and currently with chronic kidney disease, without congestive heart failure. We have discussed the medical regimen and reasons for continuation. Currently the patient is without medication side effects. We have also had a discussion on primary  and secondary effects of hypertension and currently does not need changes in this medical regimen. Mixed Hyperlipidemia The patient has cardiovascular risk factors including High LDL cholesterol and greater than 7.5% 10 year cardiovascular risk score and therefore has been placed on Moderate intensity therapy with pravastatin (Pravachol) for reduction in LDL and cardiovascular risk for which we have discussed today. Other healthy lifestyle measures have been discussed as well. We have discussed the appropriate treatment goals of the above measures which include a 30% to 50% reduction in LDL levels. The patient has a clear understanding of reasons for lipid management at this time. The patient is currently having no evidence of significant side effects of this medication at this time. Preoperative Assessment Profile Risk factors for cardiac complication with foot surgery: Positive for: Chronic kidney disease Negative for: Diabetes, Cardiomyopathy or chf, Coronary artery disease and Peripheral vascular disease Active cardiovascular conditions: Positive CLE:XNTZ Negative for: Angina or anginal equivalent, Recent infartion, Congestive heart failure symptoms, Dysrhythmia and Symptomatic valve disease Functional capacity <4 Mets Risk of surgery and/or procedure low Possible need and adjustments in therapy prior to surgery and/or procedure no Overall risk of cardiac complication with surgery and/or procedure is low, <1%  Results for orders placed or performed in visit on 10/15/18  Echo complete  Result Value Ref Range  LV Ejection Fraction (%) 55  Aortic Valve Stenosis Grade none  Aortic Valve Regurgitation Grade mild  Aortic Valve Max Velocity (m/s) 1.3 m/sec  Mitral Valve Stenosis Grade none  Mitral Valve Regurgitation Grade mild  Tricuspid Valve Regurgitation Grade trivial  LV End Diastolic Diameter (cm) 3.2 cm  LV End Systolic Diameter (cm) 2.3 cm  LV Septum Wall Thickness (cm) 1.5 cm  LV  Posterior Wall Thickness (cm) 0.96 cm  Left Atrium Diameter (cm) 3.1 cm  Narrative  CARDIOLOGY DEPARTMENT TANISA, LAGACE Miles [id number] [facility] Acct #: [id number] [address] [place], [place] [address] Date: 10/15/2018 01: 58 PM Adult Female Age: 30 yrs ECHOCARDIOGRAM REPORT Outpatient [facility] STUDY:CHEST WALL TAPE: MD1: KOWALSKI, BRUCE JAY ECHO:Yes DOPPLER:Yes FILE: BP: 120/90 mmHg COLOR:Yes CONTRAST:No MACHINE:Philips RV BIOPSY:No 3D:No SOUND QLTY:Moderate Height: 61 in MEDIUM:None Weight: 132 lb BSA: 1.6 m2 _________________________________________________________________________________________ HISTORY: DOE REASON: Assess, LV function INDICATION: LVH (left ventricular hypertrophy) due to hypertensive disease, _________________________________________________________________________________________ ECHOCARDIOGRAPHIC MEASUREMENTS 2D DIMENSIONS AORTA Values Normal Range MAIN PA Values Normal Range Annulus: 1.6 cm [2.1-2.5] PA Main: nm* [1.5-2.1] Aorta Sin: 2.8 cm [2.7-3.3] RIGHT VENTRICLE ST Junction: nm* [2.3-2.9] RV Base: nm* [<4.2] Asc.Aorta: nm* [2.3-3.1] RV Mid: 2.4 cm [<3.5] LEFT VENTRICLE RV Length: nm* [<8.6] LVIDd: 3.2 cm [3.9-5.3] INFERIOR VENA CAVA LVIDs: 2.3 cm Max. IVC: nm* [<=2.1] FS: 28.4 % [>25] Min. IVC: nm* SWT: 1.5 cm [0.5-0.9] ------------------ PWT: 0.96 cm [0.5-0.9] nm* - not measured LEFT ATRIUM LA Diam: 3.1 cm [2.7-3.8] LA A4C Area: nm* [<20] LA Volume: nm* [22-52] _________________________________________________________________________________________ ECHOCARDIOGRAPHIC DESCRIPTIONS AORTIC ROOT Size: Normal Dissection: INDETERM FOR DISSECTION AORTIC VALVE Leaflets: Tricuspid Morphology: Normal Mobility: Fully mobile LEFT VENTRICLE Size: SMALL Anterior: Normal Contraction: Normal Lateral: Normal Closest EF: >55% (Estimated) Septal: Normal LV Masses: No Masses Apical: Normal LVH: MODERATE LVH Inferior:  Normal Posterior: Normal Dias.FxClass: N/A MITRAL VALVE Leaflets: Normal Mobility: Fully mobile Morphology: Normal LEFT ATRIUM Size: Normal LA Masses: No masses IA Septum: Normal IAS MAIN PA Size: Normal PULMONIC VALVE Morphology: Normal Mobility: Fully mobile RIGHT VENTRICLE RV Masses: No Masses Size: Normal Free Wall: Normal Contraction: Normal TRICUSPID VALVE Leaflets: Normal Mobility: Fully mobile Morphology: Normal RIGHT ATRIUM Size: Normal RA Other: None RA Mass: No masses PERICARDIUM Fluid: No effusion INFERIOR VENACAVA Size: Normal Normal respiratory collapse _________________________________________________________________________________________  DOPPLER ECHO and OTHER SPECIAL PROCEDURES Aortic: MILD AR No AS 128.0 cm/sec peak vel 6.6 mmHg peak grad Mitral: MILD MR No MS 3.7 cm^2 by DOPPLER MV Inflow E Vel = 56.3 cm/sec MV Annulus E'Vel = 3.3 cm/sec E/E'Ratio = 17.1 Tricuspid: TRIVIAL TR No TS Pulmonary: No PR No PS 90.9 cm/sec peak vel 3.3 mmHg peak grad _________________________________________________________________________________________ INTERPRETATION NORMAL LEFT VENTRICULAR SYSTOLIC FUNCTION WITH MODERATE LVH NORMAL RIGHT VENTRICULAR SYSTOLIC FUNCTION MILD VALVULAR REGURGITATION (See above) NO VALVULAR STENOSIS MILD AR, MR TRIVIAL TR EF 55% _________________________________________________________________________________________ Electronically signed by Tracey Wilson Serafina Royals on 10/15/2018 02: 02 PM Performed By: Maurilio Lovely, RDCS Ordering Physician: Serafina Royals _________________________________________________________________________________________   Past Medical and Surgical History  Past Medical History Past Medical History:  Diagnosis Date  . Abnormal EKG  . Arthritis  . Bipolar disorder (CMS-HCC)  sees Dr. Vena Rua at [facility] in [place]  . Cataract cortical, senile  . Diverticulosis  confirmed by colonoscopy 2010  .  Hyperlipidemia  . Idiopathic peripheral neuropathy 06/01/2015  . Urinary incontinence, mixed 06/01/2015  . Valvular heart disease   Past Surgical History She has a past surgical history that includes Tubal ligation; Colonoscopy (2010); Cataract extraction; Colonoscopy (2013); and Colonoscopy (09/26/2017).   Medications and Allergies  Current Medications  Current Outpatient Medications on File Prior to Visit  Medication Sig Dispense Refill  . BIOTIN ORAL Take 1 capsule by mouth once daily  . co-enzyme Q-10, ubiquinone, 100 mg capsule Take 200 mg by mouth once daily  . DAILY GARLIC ONCE-A-DAY ORAL Take 1,000 mg by mouth once daily  . divalproex (DEPAKOTE ER) 500  MG ER tablet TAKE 1 TABLET BY MOUTH ONCE DAILY 90 tablet 3  . flaxseed oil Oil Take 1,000 mg by mouth once daily  . gabapentin (NEURONTIN) 300 MG capsule TAKE 2 CAPSULES BY MOUTH NIGHTLY 180 capsule 3  . imipramine (TOFRANIL) 25 MG tablet Take 25 mg by mouth nightly  . krill-omega-3-dha-epa-lipids 350-90-24-50 mg Cap Take 1 capsule by mouth once daily  . losartan (COZAAR) 25 MG tablet Take 25 mg by mouth once daily  . MAGNESIUM CITRATE ORAL Take 1 capsule by mouth once daily  . oxybutynin (DITROPAN-XL) 5 MG XL tablet TAKE 1 TABLET BY MOUTH ONCE DAILY 90 tablet 3  . pravastatin (PRAVACHOL) 40 MG tablet TAKE 1 TABLET BY MOUTH ONCE DAILY 90 tablet 1  . PREVIDENT 5000 DRY MOUTH 1.1 % gel Place onto teeth nightly 3  . QUEtiapine (SEROQUEL) 50 MG tablet Take 1 tablet (50 mg total) by mouth nightly 90 tablet 3  . turm-ging-bos-yuc-wil-cham-hor 100-100-100-125 mg Tab Take 1 capsule by mouth once daily.   No current facility-administered medications on file prior to visit.   Allergies: Latex, natural rubber; Mango; Others; Adhesive tape-silicones; Other; and Shellfish derived  Social and Family History  Social History reports that she has never smoked. She has never used smokeless tobacco. She reports current alcohol use of about 1.0  standard drinks of alcohol per week.  Family History Family History  Problem Relation Age of Onset  . Bipolar disorder Mother  . Leukemia Mother  Myelogenous leukemia  . Rheum arthritis Mother  . Stroke Father  . Rheum arthritis Father  . Breast cancer Other  Sibling   Review of Systems   Review of Systems  Positive for sob foot pain fall Negative for weight gain weight loss, weakness, vision change, hearing loss, cough, congestion , orthopnea, heartburn, nausea, diaphoresis, vomiting, diarrhea, bloody stool, melena, stomach pain, extremity pain, leg weakness, leg cramping, leg blood clots, headache, nosebleed, trouble swallowing, mouth pain, urinary frequency, urination at night, muscle weakness, skin lesions, skin rashes, tingling ,ulcers, numbness, anxiety, and/or depression Physical Examination   Vitals:BP (!) 130/90 (BP Location: Left upper arm, Patient Position: Sitting, BP Cuff Size: Adult)  Pulse 78  Resp 16  Ht 156.2 cm (5' 1.5")  Wt 60 kg (132 lb 4.4 oz)  LMP (LMP Unknown)  SpO2 98%  BMI 24.59 kg/m  Ht:156.2 cm (5' 1.5") Wt:60 kg (132 lb 4.4 oz) TDV:VOHY surface area is 1.61 meters squared. Body mass index is 24.59 kg/m. Appearance: well appearing in no acute distress HEENT: Pupils equally reactive to light and accomodation, no xanthalasma  Neck: Supple, no apparent thyromegaly, masses, or lymphadenopathy  Lungs: normal respiratory effort; no crackles, no rhonchi, no wheezes Heart: Regular rate and rhythm. Normal S1 S2 No gallops, murmur, no rub, PMI is normal size and placement. carotid upstroke normal without bruit. Jugular venous pressure is normal Abdomen: soft, nontender, not distended with normal bowel sounds. No apparent hepatosplenomegally. Abdominal aorta is normal size  Extremities: no edema, no ulcers, no clubbing, no cyanosis Peripheral Pulses: 2+ in upper extremities, 2+ femoral pulses bilaterally, 2+lower extremity  Musculoskeletal; Normal muscle tone  without kyphosis Neurological: Oriented and Alert, Cranial nerves intact  Assessment   74 y.o. female with  Encounter Diagnoses  Name Primary?  . CKD (chronic kidney disease) stage 3, GFR 30-59 ml/min (CMS-HCC) Yes  . Mixed hyperlipidemia  . Benign essential HTN  . LVH (left ventricular hypertrophy) due to hypertensive disease, without heart failure  . Abnormal ECG  Plan   -Proceed to surgery and/or invasive procedure without restriction to pre or post operative and/or procedural care. The patient is at lowest risk possible for cardiovascular complications with surgical intervention and/or invasive procedure. Currently has no evidence active and/or significant angina and/or congestive heart failure. The patient may discontinue aspirin 7 days prior to procedure and restart at a safe period thereafter -The patient will have continued renal protection with medical management for chronic kidney disease. We have discussed other lifestyle modification as well including appropriate low sodium diet. The patient understands the increased risk of morbidity, mortality, and hospitalization associated with chronic kidney disease. -We have discussed risk reduction in the cardiovascular disease process by a continuation of lipid management with the current medication management for lipid reduction. The goals continue to be 30-50% lowering of LDL cholesterol in addition to lifestyle measures. This will include diet and improved activity level on a regular basis.The patient has an understanding of this discussion at this time and we will continue the appropriate strategy. -Continue current medical regimen for hypertension control which is stable at this time and without apparent significant side effects or symptoms of medications. Further treatment goals of low sodium diet for additive effects of these medications have been discussed today as well. -We have had a long discussion about the benefits of physical  and occupational rehabilitation. The patient is advised and encouraged to enroll for improvements in quality of life and reduced hospitalization.  No orders of the defined types were placed in this encounter.  Return if symptoms worsen or fail to improve.  Tracey Dibble, Tracey Wilson    Electronically signed by Tracey Dibble, Tracey Wilson at 10/15/2018 2:14 PM EDT  Plan of Treatment - documented as of this encounter Upcoming Encounters Upcoming Encounters  Date Type Specialty Care Team Description  11/28/2018 Office Visit Family Medicine Feldpausch, Donzetta Matters., Tracey Wilson  [place]  [place], [place] [address]  [phone]  [phone] (Fax)    12/03/2018 Ancillary Procedure Internal Medicine Solum, Felipa Evener, Tracey Wilson  [place]  [facility] [place], [place] [address]  [phone]  [phone] ([phone]    12/09/2018 Post Op Podiatry Troxler, Titus Dubin, [place]  [facility] [place], [place] [address]  [phone]  [phone] (Fax)    12/10/2018 Ancillary Procedure Obstetrics and Gynecology Schermerhorn, Burman Blacksmith, Tracey Wilson  [address]  [place] [facility]  [place], [place] [address]  [phone]  [phone] ([phone]    12/10/2018 Office Visit Obstetrics and Gynecology Schermerhorn, Burman Blacksmith, Tracey Wilson  [address]  [place] [facility]  [place], [place] [address]  [phone]  [phone] (Fax)    01/08/2019 Office Visit Dermatology Tamsen Meek, [place] [address]  [place], [place] [address]  [phone]  [phone] (Fax)    01/13/2019 Office Visit Family Medicine Feldpausch, Donzetta Matters., Tracey Wilson  [place]  [place], [place] [address]  [phone]  [phone] (Fax)    Visit Diagnoses - documented in this encounter Diagnosis  CKD (chronic kidney disease) stage 3, GFR 30-59 ml/min (CMS-HCC) - Primary  Chronic kidney  disease, Stage III (moderate)   Mixed hyperlipidemia   Benign essential HTN   LVH (left ventricular hypertrophy) due to hypertensive disease, without heart failure   Abnormal ECG  Nonspecific abnormal electrocardiogram (ECG) (EKG)   Historical Medications - added in this encounter This list may reflect changes made after this encounter.  Medication Sig Dispensed Refills Start Date End  Date  losartan (COZAAR) 25 MG tablet  Take 25 mg by mouth once daily  0 09/29/2018   Images Patient Demographics  Patient Address Communication Language Race / Ethnicity Marital Status  [address] [place], [place] [address] [phone] [facility]) [phone] (Mobile) [email or web address] English (Preferred) White / Not Hispanic or Latino Divorced  Patient Contacts  Contact Name Contact Address Communication Relationship to Patient  Malani Lees [address] [place], [place] [address] [phone] [facility]) [phone] [facility]) Son or Daughter, Yenni Carra Unknown [phone] [facility]) Son or Daughter, Emergency Contact  Document Information  Primary Care Provider Other Service Providers Document Coverage Dates  Heron Nay, Tracey Wilson (Aug. 27, 2018August 27, 2018 - Present) [phone] (Work) [phone] (Fax) [place], [place] [address] Family Medicine [facility] [place], [place] [address] Tamsen Meek, Tracey Wilson (Consulting Provider) [phone] (Work) [phone] (Fax) [address] [place], [place] [address] Dermatology [facility] [place], [place] [address] Apr. 15, 2020April 15, 2020 - Apr. 19, 2020April 19, 2020   [place] [address] [place], [place] [address]   Encounter Providers Encounter Date  Tracey Dibble, Tracey Wilson (Attending) [phone] (Work) [phone] (Fax) [place] [facility] [place], [place]  [address] Cardiovascular Disease Apr. 15, 2020April 15, 2020 - Apr. 19, 2020April 19, 2020    Show All Sections

## 2020-02-02 ENCOUNTER — Other Ambulatory Visit: Payer: Self-pay

## 2020-02-02 ENCOUNTER — Encounter
Admission: RE | Admit: 2020-02-02 | Discharge: 2020-02-02 | Disposition: A | Discharge status: Home / Self Care | Payer: Medicare Other | Source: Ambulatory Visit | Attending: Obstetrics and Gynecology | Admitting: Obstetrics and Gynecology

## 2020-02-02 NOTE — Patient Instructions (Signed)
Your procedure is scheduled on: Monday February 08, 2020. Report to Day Surgery inside [place] 2nd floor. To find out your arrival time please call [phone] between 1PM - 3PM on Friday February 05, 2020.  Remember: Instructions that are not followed completely may result in serious medical risk,  up to and including death, or upon the discretion of your surgeon and anesthesiologist your  surgery may need to be rescheduled.     _X__ 1. Do not eat food after midnight the night before your procedure.                 No gum chewing or hard candies. You may drink clear liquids up to 2 hours                 before you are scheduled to arrive for your surgery- DO not drink clear                 liquids within 2 hours of the start of your surgery.                 Clear Liquids include:  water, apple juice without pulp, clear Gatorade, G2 or                  Gatorade Zero (avoid Red/Purple/Blue), Black Coffee or Tea (Do not add                 anything to coffee or tea).  __X__2.   Complete the carbohydrate drink provided to you, 2 hours before arrival.  __X__3. On the morning of surgery brush your teeth with toothpaste and water, you                may rinse your mouth with mouthwash if you wish.  Do not swallow any toothpaste of mouthwash.     _X__ 4.  No Alcohol for 24 hours before or after surgery.   _X__ 5.  Do Not Smoke or use e-cigarettes For 24 Hours Prior to Your Surgery.                 Do not use any chewable tobacco products for at least 6 hours prior to                 Surgery.  _X__  6.  Do not use any recreational drugs (marijuana, cocaine, heroin, ecstacy, MDMA or other)                For at least one week prior to your surgery.  Combination of these drugs with anesthesia                May have life threatening results.  __X__  7.  Notify your doctor if there is any change in your medical condition      (cold, fever, infections).     Do not wear  jewelry, make-up, hairpins, clips or nail polish. Do not wear lotions, powders, or perfumes. You may wear deodorant. Do not shave 48 hours prior to surgery. Men may shave face and neck. Do not bring valuables to the hospital.    [facility] is not responsible for any belongings or valuables.  Contacts, dentures or bridgework may not be worn into surgery. Leave your suitcase in the car. After surgery it may be brought to your room. For patients admitted to the hospital, discharge time is determined by your treatment team.   Patients discharged the day of surgery  will not be allowed to drive home.   Make arrangements for someone to be with you for the first 24 hours of your Same Day Discharge.   ____ Take these medicines the morning of surgery with A SIP OF WATER:    1. None   __X__ Stop aspirin as instructed by your doctor.   __X__ Stop Anti-inflammatories such as ibuprofen, Aleve, Advil, naproxen, and or BC powders.   __X__ Stop supplements until after surgery.    __X__ Do not start any herbal supplements before your surgery.

## 2020-02-05 ENCOUNTER — Other Ambulatory Visit: Payer: Self-pay

## 2020-02-05 ENCOUNTER — Other Ambulatory Visit
Admission: RE | Admit: 2020-02-05 | Discharge: 2020-02-05 | Disposition: A | Discharge status: Home / Self Care | Payer: Medicare Other | Source: Ambulatory Visit | Attending: Obstetrics and Gynecology | Admitting: Obstetrics and Gynecology

## 2020-02-05 DIAGNOSIS — Z20822 Contact with and (suspected) exposure to covid-19: Secondary | ICD-10-CM | POA: Diagnosis not present

## 2020-02-05 DIAGNOSIS — Z01812 Encounter for preprocedural laboratory examination: Secondary | ICD-10-CM | POA: Insufficient documentation

## 2020-02-05 LAB — CBC
HCT: 36.5 % (ref 36.0–46.0)
Hemoglobin: 11.7 g/dL — ABNORMAL LOW (ref 12.0–15.0)
MCH: 29.2 pg (ref 26.0–34.0)
MCHC: 32.1 g/dL (ref 30.0–36.0)
MCV: 91 fL (ref 80.0–100.0)
Platelets: 191 10*3/uL (ref 150–400)
RBC: 4.01 MIL/uL (ref 3.87–5.11)
RDW: 13.8 % (ref 11.5–15.5)
WBC: 5.2 10*3/uL (ref 4.0–10.5)
nRBC: 0 % (ref 0.0–0.2)

## 2020-02-05 LAB — SARS CORONAVIRUS 2 (TAT 6-24 HRS): SARS Coronavirus 2: NEGATIVE

## 2020-02-05 LAB — BASIC METABOLIC PANEL
Anion gap: 8 (ref 5–15)
BUN: 22 mg/dL (ref 8–23)
CO2: 27 mmol/L (ref 22–32)
Calcium: 9.6 mg/dL (ref 8.9–10.3)
Chloride: 104 mmol/L (ref 98–111)
Creatinine, Ser: 1.23 mg/dL — ABNORMAL HIGH (ref 0.44–1.00)
GFR calc Af Amer: 50 mL/min — ABNORMAL LOW (ref >60)
GFR calc non Af Amer: 43 mL/min — ABNORMAL LOW (ref >60)
Glucose, Bld: 104 mg/dL — ABNORMAL HIGH (ref 70–99)
Potassium: 3.9 mmol/L (ref 3.5–5.1)
Sodium: 139 mmol/L (ref 135–145)

## 2020-02-05 LAB — TYPE AND SCREEN
ABO/RH(D): O POS
Antibody Screen: NEGATIVE

## 2020-02-08 ENCOUNTER — Encounter
Admission: RE | Disposition: A | Discharge status: Home / Self Care | Payer: Self-pay | Source: Home / Self Care | Attending: Obstetrics and Gynecology

## 2020-02-08 ENCOUNTER — Encounter: Payer: Self-pay | Admitting: Obstetrics and Gynecology

## 2020-02-08 ENCOUNTER — Ambulatory Visit
Admission: RE | Admit: 2020-02-08 | Discharge: 2020-02-08 | Disposition: A | Discharge status: Home / Self Care | Payer: Medicare Other | Attending: Obstetrics and Gynecology | Admitting: Obstetrics and Gynecology

## 2020-02-08 ENCOUNTER — Ambulatory Visit: Payer: Medicare Other | Admitting: Certified Registered"

## 2020-02-08 DIAGNOSIS — F319 Bipolar disorder, unspecified: Secondary | ICD-10-CM | POA: Diagnosis not present

## 2020-02-08 DIAGNOSIS — N189 Chronic kidney disease, unspecified: Secondary | ICD-10-CM | POA: Insufficient documentation

## 2020-02-08 DIAGNOSIS — N736 Female pelvic peritoneal adhesions (postinfective): Secondary | ICD-10-CM | POA: Insufficient documentation

## 2020-02-08 DIAGNOSIS — Z79899 Other long term (current) drug therapy: Secondary | ICD-10-CM | POA: Diagnosis not present

## 2020-02-08 DIAGNOSIS — Z85828 Personal history of other malignant neoplasm of skin: Secondary | ICD-10-CM | POA: Insufficient documentation

## 2020-02-08 DIAGNOSIS — N83202 Unspecified ovarian cyst, left side: Secondary | ICD-10-CM | POA: Diagnosis present

## 2020-02-08 DIAGNOSIS — R978 Other abnormal tumor markers: Secondary | ICD-10-CM | POA: Diagnosis not present

## 2020-02-08 DIAGNOSIS — E785 Hyperlipidemia, unspecified: Secondary | ICD-10-CM | POA: Diagnosis not present

## 2020-02-08 DIAGNOSIS — G609 Hereditary and idiopathic neuropathy, unspecified: Secondary | ICD-10-CM | POA: Insufficient documentation

## 2020-02-08 DIAGNOSIS — N838 Other noninflammatory disorders of ovary, fallopian tube and broad ligament: Secondary | ICD-10-CM | POA: Diagnosis not present

## 2020-02-08 DIAGNOSIS — H9191 Unspecified hearing loss, right ear: Secondary | ICD-10-CM | POA: Insufficient documentation

## 2020-02-08 DIAGNOSIS — D271 Benign neoplasm of left ovary: Secondary | ICD-10-CM | POA: Insufficient documentation

## 2020-02-08 DIAGNOSIS — I129 Hypertensive chronic kidney disease with stage 1 through stage 4 chronic kidney disease, or unspecified chronic kidney disease: Secondary | ICD-10-CM | POA: Diagnosis not present

## 2020-02-08 HISTORY — PX: LAPAROSCOPIC BILATERAL SALPINGO OOPHERECTOMY: SHX5890

## 2020-02-08 LAB — ABO/RH: ABO/RH(D): O POS

## 2020-02-08 SURGERY — SALPINGO-OOPHORECTOMY, BILATERAL, LAPAROSCOPIC
Anesthesia: General | Laterality: Bilateral

## 2020-02-08 MED ORDER — KETOROLAC TROMETHAMINE 30 MG/ML IJ SOLN
INTRAMUSCULAR | Status: AC
  Filled 2020-02-08: qty 1

## 2020-02-08 MED ORDER — SUCCINYLCHOLINE CHLORIDE 20 MG/ML IJ SOLN
INTRAMUSCULAR | Status: DC | PRN
  Administered 2020-02-08: 100 mg via INTRAVENOUS

## 2020-02-08 MED ORDER — DEXMEDETOMIDINE HCL IN NACL 80 MCG/20ML IV SOLN
INTRAVENOUS | Status: AC
  Filled 2020-02-08: qty 20

## 2020-02-08 MED ORDER — FAMOTIDINE 20 MG PO TABS
ORAL_TABLET | ORAL | Status: AC
  Filled 2020-02-08: qty 1

## 2020-02-08 MED ORDER — KETOROLAC TROMETHAMINE 30 MG/ML IJ SOLN
INTRAMUSCULAR | Status: DC | PRN
  Administered 2020-02-08: 15 mg via INTRAVENOUS

## 2020-02-08 MED ORDER — ACETAMINOPHEN 500 MG PO TABS: 1000.0000 mg | ORAL_TABLET | ORAL | Status: AC

## 2020-02-08 MED ORDER — ONDANSETRON HCL 4 MG/2ML IJ SOLN
INTRAMUSCULAR | Status: AC
  Filled 2020-02-08: qty 2

## 2020-02-08 MED ORDER — CHLORHEXIDINE GLUCONATE 0.12 % MT SOLN
OROMUCOSAL | Status: AC
  Administered 2020-02-08: 15 mL via OROMUCOSAL
  Filled 2020-02-08: qty 15

## 2020-02-08 MED ORDER — SILVER NITRATE-POT NITRATE 75-25 % EX MISC
CUTANEOUS | Status: AC
  Filled 2020-02-08: qty 10

## 2020-02-08 MED ORDER — OXYCODONE-ACETAMINOPHEN 5-325 MG PO TABS: 1.0000 | ORAL_TABLET | Freq: Four times a day (QID) | ORAL | Status: DC | PRN

## 2020-02-08 MED ORDER — BUPIVACAINE HCL (PF) 0.5 % IJ SOLN
INTRAMUSCULAR | Status: AC
  Filled 2020-02-08: qty 30

## 2020-02-08 MED ORDER — BUPIVACAINE HCL 0.5 % IJ SOLN
INTRAMUSCULAR | Status: DC | PRN
  Administered 2020-02-08: 13 mL

## 2020-02-08 MED ORDER — LIDOCAINE HCL (PF) 2 % IJ SOLN
INTRAMUSCULAR | Status: AC
  Filled 2020-02-08: qty 5

## 2020-02-08 MED ORDER — ROCURONIUM BROMIDE 100 MG/10ML IV SOLN
INTRAVENOUS | Status: DC | PRN
  Administered 2020-02-08: 5 mg via INTRAVENOUS
  Administered 2020-02-08: 35 mg via INTRAVENOUS

## 2020-02-08 MED ORDER — OXYCODONE HCL 5 MG/5ML PO SOLN: 5.0000 mg | Freq: Once | ORAL | Status: AC | PRN

## 2020-02-08 MED ORDER — SUCCINYLCHOLINE CHLORIDE 200 MG/10ML IV SOSY
PREFILLED_SYRINGE | INTRAVENOUS | Status: AC
  Filled 2020-02-08: qty 10

## 2020-02-08 MED ORDER — POVIDONE-IODINE 10 % EX SWAB: 2.0000 "application " | Freq: Once | CUTANEOUS | Status: DC

## 2020-02-08 MED ORDER — DEXAMETHASONE SODIUM PHOSPHATE 10 MG/ML IJ SOLN
INTRAMUSCULAR | Status: AC
  Filled 2020-02-08: qty 1

## 2020-02-08 MED ORDER — FENTANYL CITRATE (PF) 100 MCG/2ML IJ SOLN
INTRAMUSCULAR | Status: AC
  Administered 2020-02-08: 50 ug via INTRAVENOUS
  Filled 2020-02-08: qty 2

## 2020-02-08 MED ORDER — LIDOCAINE HCL (CARDIAC) PF 100 MG/5ML IV SOSY
PREFILLED_SYRINGE | INTRAVENOUS | Status: DC | PRN
  Administered 2020-02-08: 50 mg via INTRAVENOUS

## 2020-02-08 MED ORDER — PROPOFOL 10 MG/ML IV BOLUS
INTRAVENOUS | Status: AC
  Filled 2020-02-08: qty 20

## 2020-02-08 MED ORDER — GLYCOPYRROLATE 0.2 MG/ML IJ SOLN
INTRAMUSCULAR | Status: AC
  Filled 2020-02-08: qty 1

## 2020-02-08 MED ORDER — ONDANSETRON 4 MG PO TBDP: 4.0000 mg | ORAL_TABLET | Freq: Four times a day (QID) | ORAL | Status: DC | PRN

## 2020-02-08 MED ORDER — CHLORHEXIDINE GLUCONATE 0.12 % MT SOLN: 15.0000 mL | Freq: Once | OROMUCOSAL | Status: AC

## 2020-02-08 MED ORDER — ORAL CARE MOUTH RINSE: 15.0000 mL | Freq: Once | OROMUCOSAL | Status: AC

## 2020-02-08 MED ORDER — ROCURONIUM BROMIDE 10 MG/ML (PF) SYRINGE
PREFILLED_SYRINGE | INTRAVENOUS | Status: AC
  Filled 2020-02-08: qty 10

## 2020-02-08 MED ORDER — PROPOFOL 10 MG/ML IV BOLUS
INTRAVENOUS | Status: DC | PRN
  Administered 2020-02-08: 120 mg via INTRAVENOUS

## 2020-02-08 MED ORDER — FENTANYL CITRATE (PF) 100 MCG/2ML IJ SOLN
25.0000 ug | INTRAMUSCULAR | Status: DC | PRN
  Administered 2020-02-08 (×2): 25 ug via INTRAVENOUS

## 2020-02-08 MED ORDER — ACETAMINOPHEN 500 MG PO TABS
ORAL_TABLET | ORAL | Status: AC
  Administered 2020-02-08: 1000 mg via ORAL
  Filled 2020-02-08: qty 2

## 2020-02-08 MED ORDER — EPHEDRINE SULFATE 50 MG/ML IJ SOLN
INTRAMUSCULAR | Status: DC | PRN
  Administered 2020-02-08: 10 mg via INTRAVENOUS
  Administered 2020-02-08 (×2): 20 mg via INTRAVENOUS

## 2020-02-08 MED ORDER — SODIUM CHLORIDE 0.9 % IV SOLN: INTRAVENOUS | Status: DC

## 2020-02-08 MED ORDER — FENTANYL CITRATE (PF) 100 MCG/2ML IJ SOLN
INTRAMUSCULAR | Status: AC
  Filled 2020-02-08: qty 2

## 2020-02-08 MED ORDER — SUGAMMADEX SODIUM 200 MG/2ML IV SOLN
INTRAVENOUS | Status: DC | PRN
  Administered 2020-02-08: 237.6 mg via INTRAVENOUS

## 2020-02-08 MED ORDER — GABAPENTIN 300 MG PO CAPS: 300.0000 mg | ORAL_CAPSULE | ORAL | Status: AC

## 2020-02-08 MED ORDER — EPHEDRINE 5 MG/ML INJ
INTRAVENOUS | Status: AC
  Filled 2020-02-08: qty 10

## 2020-02-08 MED ORDER — OXYCODONE HCL 5 MG PO TABS
ORAL_TABLET | ORAL | Status: AC
  Filled 2020-02-08: qty 1

## 2020-02-08 MED ORDER — DEXAMETHASONE SODIUM PHOSPHATE 10 MG/ML IJ SOLN
INTRAMUSCULAR | Status: DC | PRN
  Administered 2020-02-08: 10 mg via INTRAVENOUS

## 2020-02-08 MED ORDER — FAMOTIDINE 20 MG PO TABS: 20.0000 mg | ORAL_TABLET | Freq: Once | ORAL | Status: DC

## 2020-02-08 MED ORDER — OXYCODONE HCL 5 MG PO TABS
5.0000 mg | ORAL_TABLET | Freq: Once | ORAL | Status: AC | PRN
  Administered 2020-02-08: 5 mg via ORAL

## 2020-02-08 MED ORDER — GABAPENTIN 300 MG PO CAPS
ORAL_CAPSULE | ORAL | Status: AC
  Administered 2020-02-08: 300 mg via ORAL
  Filled 2020-02-08: qty 1

## 2020-02-08 MED ORDER — ONDANSETRON HCL 4 MG/2ML IJ SOLN
INTRAMUSCULAR | Status: DC | PRN
  Administered 2020-02-08: 4 mg via INTRAVENOUS

## 2020-02-08 MED ORDER — FENTANYL CITRATE (PF) 100 MCG/2ML IJ SOLN
INTRAMUSCULAR | Status: DC | PRN
  Administered 2020-02-08 (×2): 50 ug via INTRAVENOUS

## 2020-02-08 MED ORDER — LACTATED RINGERS IV SOLN: INTRAVENOUS | Status: DC

## 2020-02-08 SURGICAL SUPPLY — 46 items
APL PRP STRL LF DISP 70% ISPRP (MISCELLANEOUS) ×1
BAG DRN RND TRDRP ANRFLXCHMBR (UROLOGICAL SUPPLIES) ×1
BAG SPEC RTRVL LRG 6X4 10 (ENDOMECHANICALS) ×1
BAG URINE DRAIN 2000ML AR STRL (UROLOGICAL SUPPLIES) ×3 IMPLANT
BLADE SURG SZ11 CARB STEEL (BLADE) ×3 IMPLANT
CANISTER SUCT 1200ML W/VALVE (MISCELLANEOUS) ×3 IMPLANT
CATH FOLEY 2WAY  5CC 16FR (CATHETERS) ×2
CATH FOLEY 2WAY 5CC 16FR (CATHETERS) ×1
CATH ROBINSON RED A/P 16FR (CATHETERS) ×1 IMPLANT
CATH URTH 16FR FL 2W BLN LF (CATHETERS) IMPLANT
CHLORAPREP W/TINT 26 (MISCELLANEOUS) ×3 IMPLANT
CLOSURE WOUND 1/4X4 (GAUZE/BANDAGES/DRESSINGS) ×1
COVER WAND RF STERILE (DRAPES) ×3 IMPLANT
DRSG TEGADERM 2-3/8X2-3/4 SM (GAUZE/BANDAGES/DRESSINGS) ×3 IMPLANT
GAUZE 4X4 16PLY RFD (DISPOSABLE) ×2 IMPLANT
GLOVE SURG SYN 8.0 (GLOVE) ×3 IMPLANT
GLOVE SURG SYN 8.0 PF PI (GLOVE) ×1 IMPLANT
GOWN STRL REUS W/ TWL LRG LVL3 (GOWN DISPOSABLE) ×2 IMPLANT
GOWN STRL REUS W/ TWL XL LVL3 (GOWN DISPOSABLE) ×1 IMPLANT
GOWN STRL REUS W/TWL LRG LVL3 (GOWN DISPOSABLE) ×6
GOWN STRL REUS W/TWL XL LVL3 (GOWN DISPOSABLE) ×3
GRASPER SUT TROCAR 14GX15 (MISCELLANEOUS) ×3 IMPLANT
IRRIGATION STRYKERFLOW (MISCELLANEOUS) ×1 IMPLANT
IRRIGATOR STRYKERFLOW (MISCELLANEOUS) ×3
IV NS 1000ML (IV SOLUTION) ×3
IV NS 1000ML BAXH (IV SOLUTION) ×1 IMPLANT
KIT TURNOVER CYSTO (KITS) ×3 IMPLANT
LABEL OR SOLS (LABEL) ×3 IMPLANT
NS IRRIG 500ML POUR BTL (IV SOLUTION) ×3 IMPLANT
PACK GYN LAPAROSCOPIC (MISCELLANEOUS) ×3 IMPLANT
PAD OB MATERNITY 4.3X12.25 (PERSONAL CARE ITEMS) ×3 IMPLANT
PAD PREP 24X41 OB/GYN DISP (PERSONAL CARE ITEMS) ×3 IMPLANT
POUCH SPECIMEN RETRIEVAL 10MM (ENDOMECHANICALS) ×3 IMPLANT
SET TUBE SMOKE EVAC HIGH FLOW (TUBING) ×3 IMPLANT
SHEARS HARMONIC ACE PLUS 36CM (ENDOMECHANICALS) ×3 IMPLANT
SLEEVE ENDOPATH XCEL 5M (ENDOMECHANICALS) ×3 IMPLANT
SPONGE GAUZE 2X2 8PLY STER LF (GAUZE/BANDAGES/DRESSINGS) ×1
SPONGE GAUZE 2X2 8PLY STRL LF (GAUZE/BANDAGES/DRESSINGS) ×2 IMPLANT
STRIP CLOSURE SKIN 1/4X4 (GAUZE/BANDAGES/DRESSINGS) ×2 IMPLANT
SUT VIC AB 0 CT1 36 (SUTURE) ×3 IMPLANT
SUT VIC AB 2-0 UR6 27 (SUTURE) ×3 IMPLANT
SUT VIC AB 4-0 SH 27 (SUTURE) ×6
SUT VIC AB 4-0 SH 27XANBCTRL (SUTURE) ×1 IMPLANT
SWABSTK COMLB BENZOIN TINCTURE (MISCELLANEOUS) ×3 IMPLANT
TROCAR ENDO BLADELESS 11MM (ENDOMECHANICALS) ×3 IMPLANT
TROCAR XCEL NON-BLD 5MMX100MML (ENDOMECHANICALS) ×3 IMPLANT

## 2020-02-08 NOTE — Transfer of Care (Signed)
Immediate Anesthesia Transfer of Care Note  Patient: Tracey Wilson  Procedure(s) Performed: Procedure(s): LAPAROSCOPIC BILATERAL SALPINGO OOPHORECTOMY, ABDOMINAL AHESIONS LYSIS (Bilateral)  Patient Location: PACU  Anesthesia Type:General  Level of Consciousness: sedated  Airway & Oxygen Therapy: Patient Spontanous Breathing and Patient connected to face mask oxygen  Post-op Assessment: Report given to RN and Post -op Vital signs reviewed and stable  Post vital signs: Reviewed and stable  Last Vitals:  Vitals:   02/08/20 1013 02/08/20 1611  BP: 138/77 (!) 164/89  Pulse: 84 80  Resp: 20 18  Temp:    SpO2: 072% 257%    Complications: No apparent anesthesia complications

## 2020-02-08 NOTE — Brief Op Note (Signed)
02/08/2020  3:57 PM  PATIENT:  Tracey Wilson  74 y.o. female  PRE-OPERATIVE DIAGNOSIS:  complex (L) ovarian cyst, abnormal cancer antigen  POST-OPERATIVE DIAGNOSIS:  complex (L) ovarian cyst, abnormal cancer antigen Significant abdominopelvic adhesions  PROCEDURE:  Procedure(s): LAPAROSCOPIC BILATERAL SALPINGO OOPHORECTOMY, ABDOMINAL AHESIONS LYSIS (Bilateral)  SURGEON:  Surgeon(s) and Role:    * Kennedie Pardoe, Gwen Her, MD - Primary    * Benjaman Kindler, MD - Assisting  PHYSICIAN ASSISTANT: PA Student Yehuda Savannah   ASSISTANTS: none   ANESTHESIA:   spinal  EBL:  5 mL IOF =800cc , uo = 500cc   BLOOD ADMINISTERED:none  DRAINS: none   LOCAL MEDICATIONS USED:  MARCAINE     SPECIMEN:  Source of Specimen:  left tube and ovary with cyst , right tube and ovary   DISPOSITION OF SPECIMEN:  PATHOLOGY  COUNTS:  YES  TOURNIQUET:  * No tourniquets in log *  DICTATION: .Other Dictation: Dictation Number verbal  PLAN OF CARE: Discharge to home after PACU  PATIENT DISPOSITION:  PACU - hemodynamically stable.   Delay start of Pharmacological VTE agent (>24hrs) due to surgical blood loss or risk of bleeding: not applicable

## 2020-02-08 NOTE — Discharge Instructions (Signed)

## 2020-02-08 NOTE — Anesthesia Preprocedure Evaluation (Signed)
Anesthesia Evaluation  Patient identified by MRN, date of birth, ID band Patient awake    Reviewed: Allergy & Precautions, H&P , NPO status , Patient's Chart, lab work & pertinent test results  History of Anesthesia Complications Negative for: history of anesthetic complications  Airway Mallampati: III  TM Distance: >3 FB Neck ROM: limited    Dental  (+) Chipped, Poor Dentition, Missing   Pulmonary neg pulmonary ROS, neg shortness of breath,    Pulmonary exam normal        Cardiovascular Exercise Tolerance: Good hypertension, (-) angina(-) Past MI and (-) DOE negative cardio ROS Normal cardiovascular exam+ dysrhythmias      Neuro/Psych PSYCHIATRIC DISORDERS  Neuromuscular disease    GI/Hepatic negative GI ROS, Neg liver ROS, neg GERD  ,  Endo/Other  negative endocrine ROS  Renal/GU CRFRenal disease     Musculoskeletal  (+) Arthritis ,   Abdominal   Peds  Hematology negative hematology ROS (+)   Anesthesia Other Findings Past Medical History: No date: Arthritis     Comment:  osteo, multiple sites No date: Bipolar 1 disorder, mixed (HCC) No date: Cancer ([place])     Comment:  skin-malignant neoplasm,scc No date: Chronic kidney disease No date: Cyst of ovary No date: Dysrhythmia     Comment:  left ventricular systolic dysfunction No date: HOH (hard of hearing)     Comment:  right ear No date: Hyperlipidemia No date: Hypertension No date: Lordosis of lumbar region     Comment:  kyphosis No date: Peripheral neuropathy     Comment:  balls of feet No date: RBBB (right bundle branch block)     Comment:  Dr Gareth Eagle [phone] No date: Serum lipids high No date: UTI (urinary tract infection)     Comment:  June at [facility] ER No date: Vertigo     Comment:  dental office chairs  Past Surgical History: years ago: BREAST BIOPSY; Right     Comment:  2 bx. done. Negative results, benign lumpectomy 2010,2013:  COLONOSCOPY     Comment:  diverticulosis 09/26/2017: COLONOSCOPY WITH PROPOFOL; N/A     Comment:  Procedure: COLONOSCOPY WITH PROPOFOL;  Surgeon:               Lollie Sails, MD;  Location: [facility] ENDOSCOPY;                Service: Endoscopy;  Laterality: N/A; [id number]: EYE SURGERY; Bilateral     Comment:  cataract, right then left 2014: FOOT SURGERY; Right     Comment:  bunionectomy with hammertoe 12/04/2018: FOREIGN BODY REMOVAL; Left     Comment:  Procedure: F/B REMOVAL, COMPLICATED;  Surgeon: Albertine Patricia, DPM;  Location: [place];  Service:               Podiatry;  Laterality: Left;   removed hammerlock implant              and screw x 1 04/27/2015: HAMMER TOE SURGERY; Left     Comment:  Procedure: HAMMER TOE CORRECTION 2ND TOE;  Surgeon:               [name], DPM;  Location: [place];                Service: Podiatry;  Laterality: Left; 04/27/2015: METATARSAL OSTEOTOMY; Left     Comment:  Procedure: METATARSAL OSTEOTOMY 1ST METATARSAL AND  Proximal phalanx osteotomy.;  Surgeon: [name],               DPM;  Location: [place];  Service: Podiatry;               Laterality: Left; 07/10/2018: MOHS SURGERY     Comment:  face and right forearm 12/04/2018: RECONSTRUCTION OF ANGULAR DEFORMITY,TOE; Left     Comment:  Procedure: RECONSTRUCTION OVERLAPPING TOE LEFT;                Surgeon: Albertine Patricia, DPM;  Location: [place];  Service: Podiatry;  Laterality: Left; No date: TUBAL LIGATION     Reproductive/Obstetrics negative OB ROS                             Anesthesia Physical Anesthesia Plan  ASA: III  Anesthesia Plan: General ETT   Post-op Pain Management:    Induction: Intravenous  PONV Risk Score and Plan: Ondansetron, Dexamethasone, Midazolam and Treatment may vary due to age or medical condition  Airway Management Planned: Oral  ETT  Additional Equipment:   Intra-op Plan:   Post-operative Plan: Extubation in OR  Informed Consent: I have reviewed the patients History and Physical, chart, labs and discussed the procedure including the risks, benefits and alternatives for the proposed anesthesia with the patient or authorized representative who has indicated his/her understanding and acceptance.     Dental Advisory Given  Plan Discussed with: Anesthesiologist, CRNA and Surgeon  Anesthesia Plan Comments: (Patient consented for risks of anesthesia including but not limited to:  - adverse reactions to medications - damage to eyes, teeth, lips or other oral mucosa - nerve damage due to positioning  - sore throat or hoarseness - Damage to heart, brain, nerves, lungs, other parts of body or loss of life  Patient voiced understanding.)        Anesthesia Quick Evaluation

## 2020-02-08 NOTE — Progress Notes (Signed)
Pt ready for L/S BSO  . Labs reviewed . All questions answered . Proceed

## 2020-02-08 NOTE — Op Note (Signed)
NAME: Tracey Wilson, Tracey Wilson MEDICAL RECORD [id number] ACCOUNT [id number] DATE OF BIRTH:[birth year] FACILITY: [facility] LOCATION: [facility] PHYSICIAN:Gwyndolyn Guilford Josefine Class, MD  OPERATIVE REPORT  DATE OF PROCEDURE:  02/08/2020  PREOPERATIVE DIAGNOSES:  Complex left ovarian cyst with an abnormal cancer antigen test.  POSTOPERATIVE DIAGNOSES: 1.  Complex left ovarian cyst with an abnormal cancer antigen test. 2.  Significant abdominopelvic adhesions.  PROCEDURES: 1.  Laparoscopic bilateral salpingectomy. 2.  Extensive abdominal and pelvic adhesiolysis incorporating greater than 50% of operating time.  SURGEON:  Boykin Nearing, MD  FIRST ASSISTANT:  Benjaman Kindler, MD  SECOND ASSISTANT:  PA student Yehuda Savannah.  ANESTHESIA:  General endotracheal anesthesia.  INDICATIONS:  A 74 year old female being followed for a complex left ovarian cyst with serial ultrasounds and concurrent cancer antigen test and CA-125 and HE4.  Her last HE4 became elevated as abnormal.  DESCRIPTION OF PROCEDURE:  After adequate general endotracheal anesthesia, the patient was placed in dorsal supine position, hips in the [place] stirrups.  Abdomen, perineum and vagina were prepped and draped in normal sterile fashion.  Timeout was  performed.  Foley catheter was placed in the bladder and single-tooth tenaculum was applied to the anterior cervix and a Kahn cannula was placed in the endocervical canal to be used for uterine manipulation during the procedure.  Gloves were changed,  gown was changed.  Attention was directed to the patient's abdomen where a 5 mm infraumbilical incision was made after injecting with 0.5% Marcaine.  A 5 mm laparoscope was advanced into the abdominal cavity under direct visualization with the Optiview  cannula.  The patient's abdomen was insufflated.  There was noted to be multiple adhesions to the pelvis as well as the right sidewall.  A second port site was placed in the left lower  quadrant 3 cm medial to the left anterior iliac spine.  An 11 mm  trocar was advanced under direct visualization.  A third port site was placed in the right lower quadrant, again a 5 mm trocar was advanced 3 cm medial to the right anterior iliac spine and under direct visualization, the trocar was advanced.  Attention  was directed to the patient's right adnexa where the fimbriated end of the fallopian tube was grasped and the ovary and the fallopian tube was placed on tension.  The infundibulopelvic ligament was cauterized with the Kleppinger cautery, followed by  transecting with the Harmonic scalpel.  The right fallopian tube and ovary, which appeared normal, was placed in the posterior cul-de-sac to be removed after the left adnexa was removed.  Attention was directed to the patient's left fallopian tube and  ovary.  The left ovary appeared to be 5 x 4 cm, bilobed, irregularly-shaped.  The left infundibulopelvic ligament was cauterized and transected with the Harmonic scalpel and an Endobag was placed into the abdomen where both left tube and ovary and right  tube and ovary were placed within.  The fascial incision on the left lower port site was opened with a Kelly clamp and the Endobag was brought through the incision.  Good hemostasis was noted.  Harmonic scalpel was then used to dissect multiple adhesions  of the small bowel draped to the right mid sidewall and anterior wall.  Total adhesiolysis was greater than 50% of total operating time.  Good hemostasis was noted.  Intraoperative pictures were taken.  Pressure was lowered to 7 mmHg and good hemostasis  still resulted.  The Carter-Thomason cone with PMI advanced through the left lower port site and 3  separate figure-of-eight sutures were used to close the fascia.  The rest of the abdomen was then deflated.  All ports were removed and the skin was  reapproximated with 4-0 Vicryl suture.  Sterile dressing applied.  Single-tooth tenaculum was removed  from the cervix, as well as the Kahn cannula.  Good hemostasis was noted and the Foley was removed.  COMPLICATIONS:  There were no complications.  ESTIMATED BLOOD LOSS:  5 mL.  INTRAOPERATIVE FLUIDS:  800 mL.  URINE OUTPUT:  500 mL.  DISPOSITION:  The patient was taken to recovery room in good condition.  VN/NUANCE  D:02/08/2020 T:02/08/2020 JOB:[id number]

## 2020-02-08 NOTE — Anesthesia Postprocedure Evaluation (Signed)
Anesthesia Post Note  Patient: Tracey Wilson  Procedure(s) Performed: LAPAROSCOPIC BILATERAL SALPINGO OOPHORECTOMY, ABDOMINAL AHESIONS LYSIS (Bilateral )  Patient location during evaluation: PACU Anesthesia Type: General Level of consciousness: awake and alert Pain management: pain level controlled Vital Signs Assessment: post-procedure vital signs reviewed and stable Respiratory status: spontaneous breathing and respiratory function stable Cardiovascular status: blood pressure returned to baseline and stable Anesthetic complications: no   No complications documented.   Last Vitals:  Vitals:   02/08/20 1657 02/08/20 1712  BP: 136/76 (!) 145/81  Pulse: 75 79  Resp: (!) 21 12  Temp: (!) 36.2 C (!) 36.3 C  SpO2: 100% 100%    Last Pain:  Vitals:   02/08/20 1712  TempSrc: Temporal  PainSc: 7                  [name]

## 2020-02-08 NOTE — Anesthesia Procedure Notes (Signed)
Procedure Name: Intubation Performed by: Rolla Plate, CRNA Pre-anesthesia Checklist: Patient identified, Patient being monitored, Timeout performed, Emergency Drugs available and Suction available Patient Re-evaluated:Patient Re-evaluated prior to induction Oxygen Delivery Method: Circle system utilized Preoxygenation: Pre-oxygenation with 100% oxygen Induction Type: IV induction Ventilation: Mask ventilation without difficulty Laryngoscope Size: 3 and McGraph Grade View: Grade I Tube type: Oral Tube size: 6.5 mm Number of attempts: 1 Airway Equipment and Method: Stylet and Video-laryngoscopy Placement Confirmation: ETT inserted through vocal cords under direct vision,  positive ETCO2 and breath sounds checked- equal and bilateral Secured at: 20 cm Tube secured with: Tape Dental Injury: Teeth and Oropharynx as per pre-operative assessment

## 2020-02-09 ENCOUNTER — Encounter: Payer: Self-pay | Admitting: Obstetrics and Gynecology

## 2020-02-16 LAB — SURGICAL PATHOLOGY

## 2020-03-26 IMAGING — MG DIGITAL SCREENING BILATERAL MAMMOGRAM WITH TOMO AND CAD
6 of 10 series · 6 of 30 positions shown · non-contrast
Comparison: Previous exam(s).

CLINICAL DATA: Screening.

EXAM:
DIGITAL SCREENING BILATERAL MAMMOGRAM WITH TOMO AND CAD

[L MLO synth-2D]
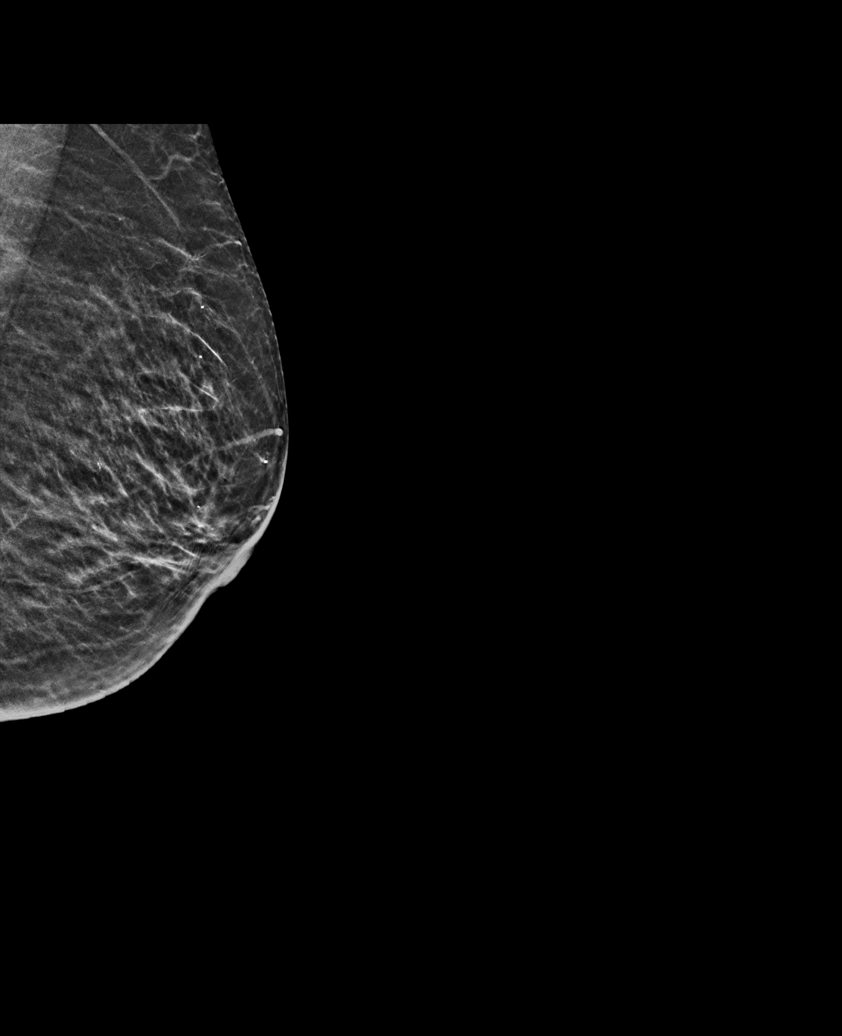

[L CC synth-2D]
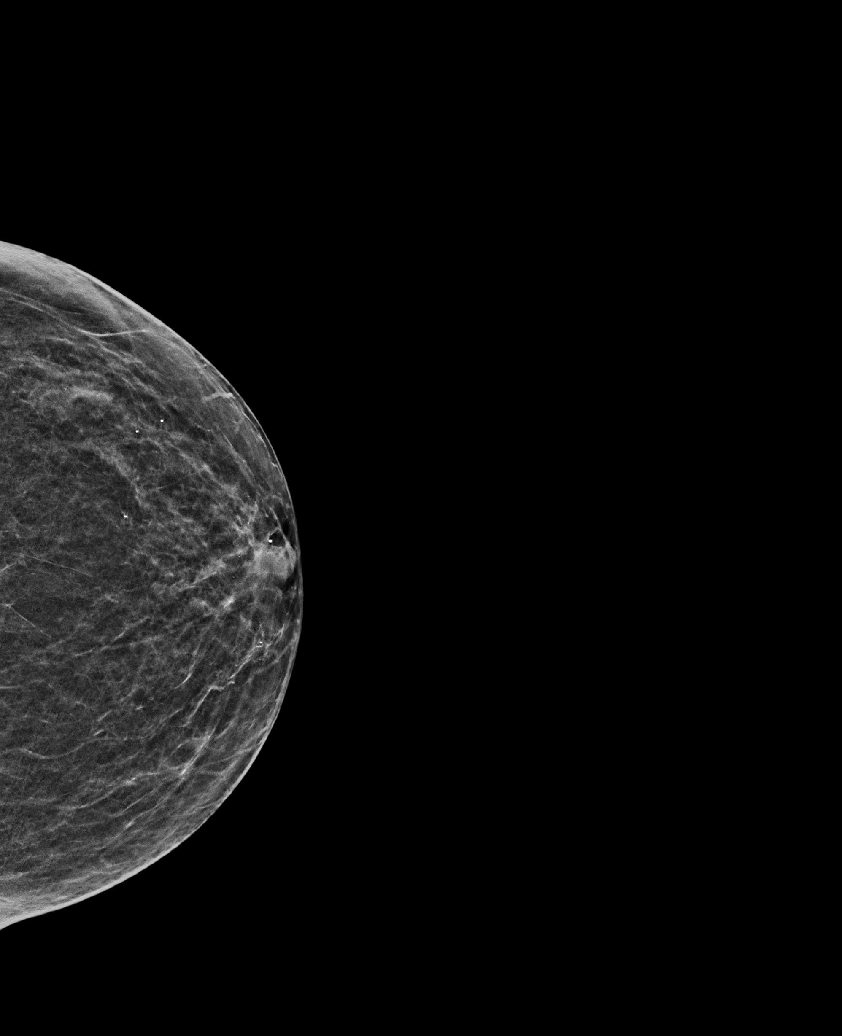

[R XCCL synth-2D]
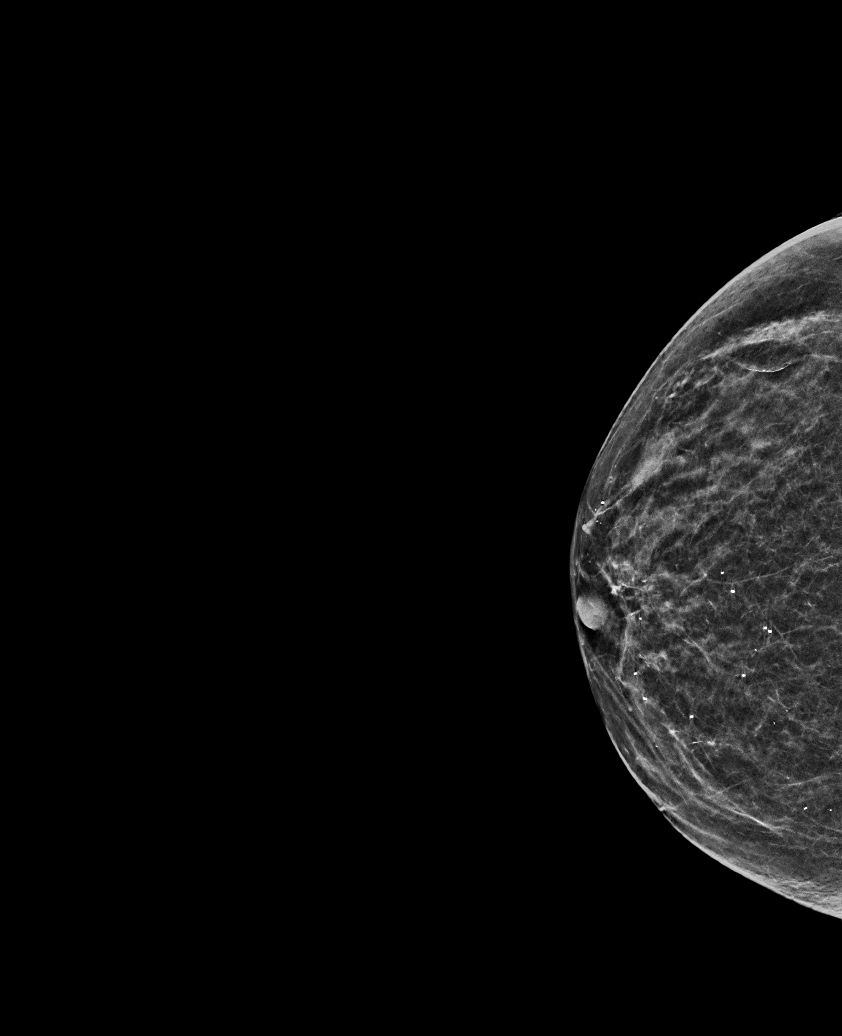

[R CC synth-2D]
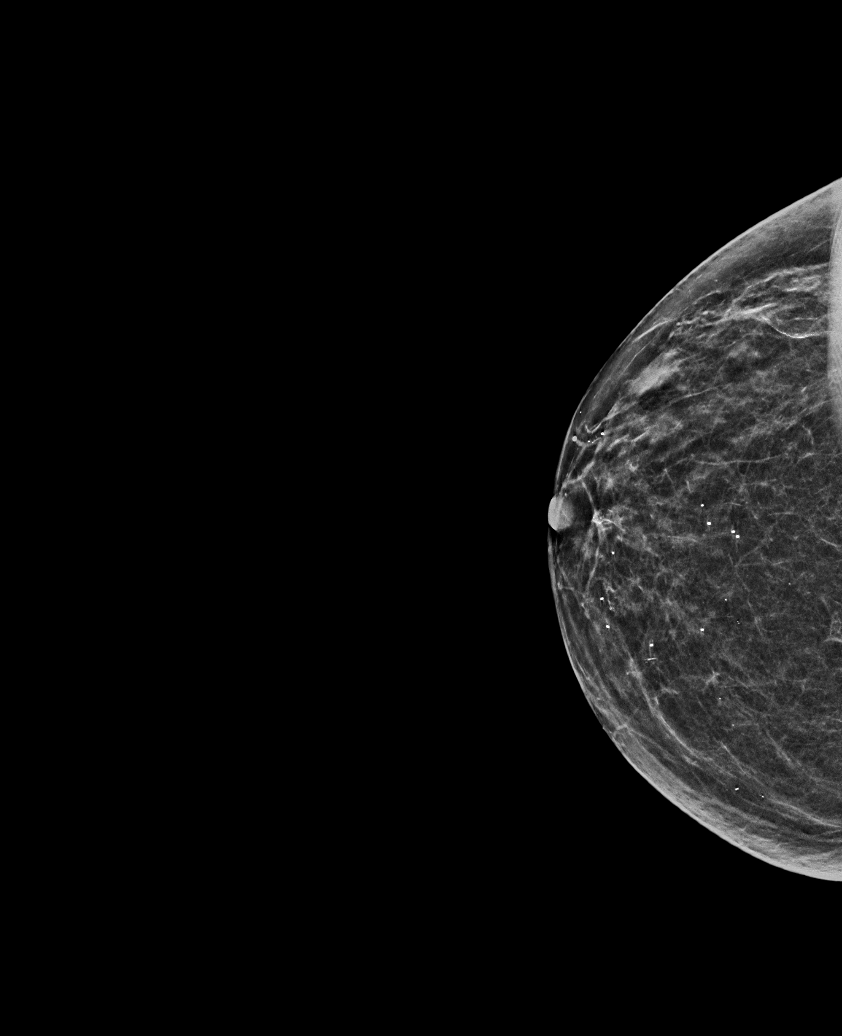

[R MLO synth-2D]
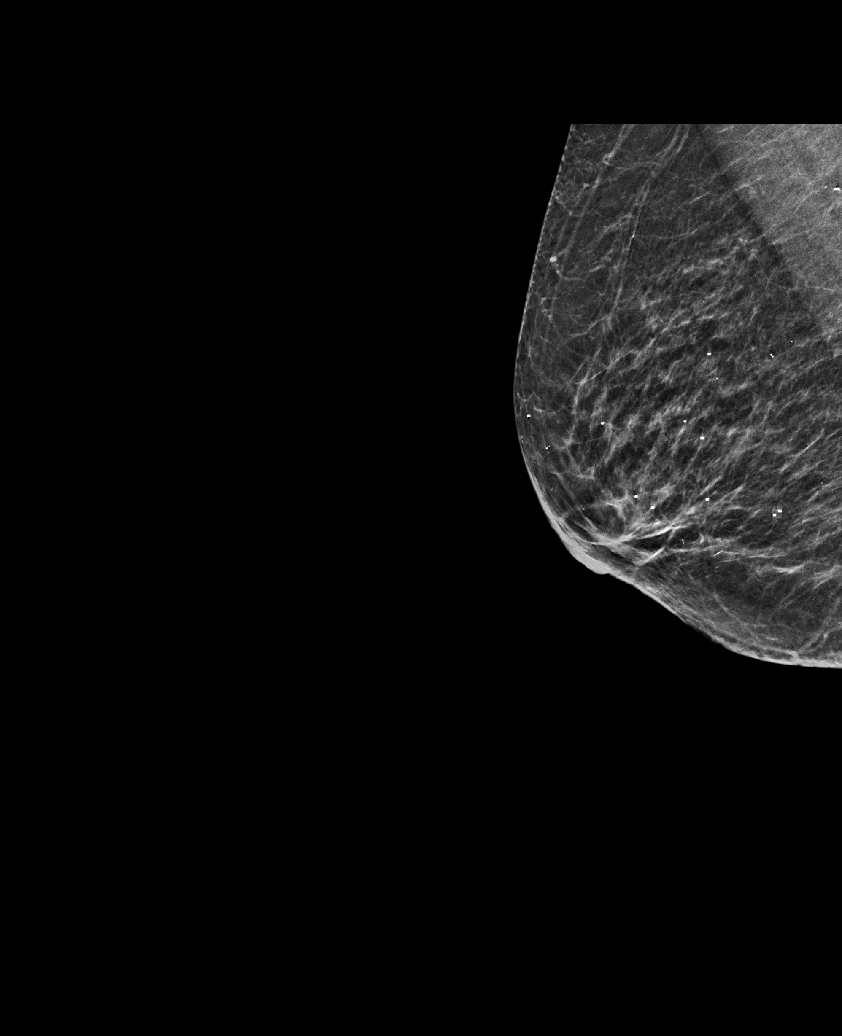

[L MLO tomo · tomo slice 24/47.0]
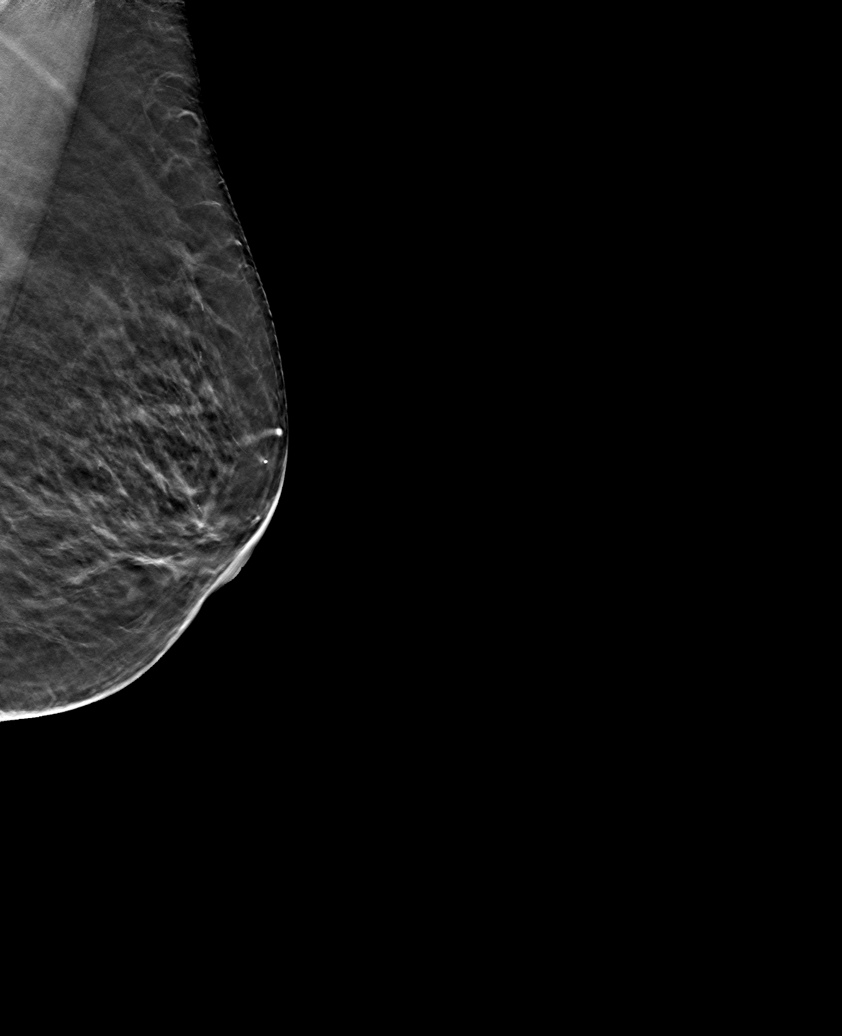

[6 of 30 positions shown; findings below may reference images not displayed]

ACR Breast Density Category b: There are scattered areas of
fibroglandular density.
FINDINGS: There are no findings suspicious for malignancy. Images were
processed with CAD.
IMPRESSION: No mammographic evidence of malignancy. A result letter of this
screening mammogram will be mailed directly to the patient.

RECOMMENDATION:
Screening mammogram in one year. (Code:[id number])

BI-RADS CATEGORY  1: Negative.

## 2020-03-30 ENCOUNTER — Inpatient Hospital Stay: Payer: Medicare Other | Attending: Obstetrics and Gynecology | Admitting: Obstetrics and Gynecology

## 2020-03-30 ENCOUNTER — Encounter: Payer: Self-pay | Admitting: Obstetrics and Gynecology

## 2020-03-30 ENCOUNTER — Other Ambulatory Visit: Payer: Self-pay

## 2020-03-30 DIAGNOSIS — Z90722 Acquired absence of ovaries, bilateral: Secondary | ICD-10-CM | POA: Diagnosis not present

## 2020-03-30 DIAGNOSIS — F319 Bipolar disorder, unspecified: Secondary | ICD-10-CM | POA: Insufficient documentation

## 2020-03-30 DIAGNOSIS — C561 Malignant neoplasm of right ovary: Secondary | ICD-10-CM | POA: Diagnosis not present

## 2020-03-30 DIAGNOSIS — G609 Hereditary and idiopathic neuropathy, unspecified: Secondary | ICD-10-CM | POA: Diagnosis not present

## 2020-03-30 DIAGNOSIS — E1136 Type 2 diabetes mellitus with diabetic cataract: Secondary | ICD-10-CM | POA: Insufficient documentation

## 2020-03-30 DIAGNOSIS — I4510 Unspecified right bundle-branch block: Secondary | ICD-10-CM | POA: Insufficient documentation

## 2020-03-30 DIAGNOSIS — Z79899 Other long term (current) drug therapy: Secondary | ICD-10-CM | POA: Diagnosis not present

## 2020-03-30 DIAGNOSIS — Z791 Long term (current) use of non-steroidal anti-inflammatories (NSAID): Secondary | ICD-10-CM | POA: Insufficient documentation

## 2020-03-30 DIAGNOSIS — M1990 Unspecified osteoarthritis, unspecified site: Secondary | ICD-10-CM | POA: Diagnosis not present

## 2020-03-30 DIAGNOSIS — E1142 Type 2 diabetes mellitus with diabetic polyneuropathy: Secondary | ICD-10-CM | POA: Diagnosis not present

## 2020-03-30 DIAGNOSIS — N83209 Unspecified ovarian cyst, unspecified side: Secondary | ICD-10-CM | POA: Insufficient documentation

## 2020-03-30 DIAGNOSIS — E042 Nontoxic multinodular goiter: Secondary | ICD-10-CM | POA: Insufficient documentation

## 2020-03-30 DIAGNOSIS — E1122 Type 2 diabetes mellitus with diabetic chronic kidney disease: Secondary | ICD-10-CM | POA: Insufficient documentation

## 2020-03-30 DIAGNOSIS — N183 Chronic kidney disease, stage 3 unspecified: Secondary | ICD-10-CM | POA: Diagnosis not present

## 2020-03-30 DIAGNOSIS — I129 Hypertensive chronic kidney disease with stage 1 through stage 4 chronic kidney disease, or unspecified chronic kidney disease: Secondary | ICD-10-CM | POA: Insufficient documentation

## 2020-03-30 DIAGNOSIS — F3174 Bipolar disorder, in full remission, most recent episode manic: Secondary | ICD-10-CM | POA: Insufficient documentation

## 2020-03-30 DIAGNOSIS — Z7982 Long term (current) use of aspirin: Secondary | ICD-10-CM | POA: Diagnosis not present

## 2020-03-30 DIAGNOSIS — E782 Mixed hyperlipidemia: Secondary | ICD-10-CM | POA: Diagnosis not present

## 2020-03-30 NOTE — Progress Notes (Signed)
Gynecologic Oncology Consult Visit   Referring Provider: Dr. Ouida Sills  Chief Complaint: serous borderline tumor  Subjective:  Tracey Wilson is a 74 y.o. female who is seen in consultation from Dr. 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   She has been followed for complex left ovarian cyst with serial ultrasounds and concurrent cancer antigen test and ca 125 and he4. HE4 was elevated at 131 6/21 and CA125 18.4.  She went to OR with Dr. Ouida Sills and Dr. Leafy Ro on 02/08/20 and underwent BSO. She was found to have a 5 cm bilobed benign left ovarian mass and normal right ovary.   DIAGNOSIS:  A. FALLOPIAN TUBE AND OVARY, LEFT; SALPINGO-OOPHORECTOMY:  - OVARY:    - BENIGN MUCINOUS CYSTADENOMA.    - FEATURES SUGGESTIVE OF RUPTURED MATURE CYSTIC TERATOMA (DERMOID CYST).    - BACKGROUND OVARIAN TISSUE WITH BENIGN PHYSIOLOGIC CHANGES.    - NEGATIVE FOR MALIGNANCY.  - FALLOPIAN TUBE:    - BENIGN PARATUBAL CYSTS.    - OTHERWISE NO SIGNIFICANT HISTOPATHOLOGIC CHANGE.   B. FALLOPIAN TUBE AND OVARY, RIGHT; SALPINGO-OOPHORECTOMY:  - OVARY:    - INCIDENTAL ATYPICAL SEROUS PROLIFERATION, PENDING CONSULTATION.    - BACKGROUND BENIGN PHYSIOLOGIC CHANGES.  - FALLOPIAN TUBE:    - NO SIGNIFICANT HISTOPATHOLOGIC CHANGE.   Comment:  The remaining right ovarian tissue was submitted for histologic evaluation. The atypical serous proliferation appears confined to the ovary. The case will be sent for expert consultation, with results included in an addendum.   ADDENDUM:  Specimen B (right fallopian tube and ovary) was sent for expert consultation by Dr. Lattie Corns at [facility], due to the presence of an incidental atypical serous proliferation. The results of this consultation are detailed below:   B. FALLOPIAN TUBE AND OVARY, RIGHT; SALPINGO-OOPHORECTOMY:  - SEROUS  BORDERLINE TUMOR OF THE OVARY (0.8 CM).  - NO SURFACE INVOLVEMENT IS IDENTIFIED.  - FALLOPIAN TUBE: NO TUMOR SEEN.   CANCER CASE SUMMARY: OVARY or FALLOPIAN TUBE or PRIMARY PERITONEUM  Procedure: Bilateral salpingo-oophorectomy  Specimen Integrity: Capsule intact  Tumor Site: Right ovary  Ovarian Surface Involvement: Absent  Fallopian Tube Surface Involvement: Absent   Tumor Size: Greatest dimension 0.8 cm  Histologic Type: Serous borderline tumor  Histologic Grade: Not applicable   Implants: Not identified  Other Tissue/ Organ Involvement: Not identified  Largest Extrapelvic Peritoneal Focus: Not applicable  Peritoneal/Ascitic Fluid: Not submitted/unknown  Treatment Effect: No known presurgical therapy   Regional Lymph Nodes: No lymph nodes submitted or found   Pathologic Stage Classification (pTNM, AJCC 8th Edition):  TNM Descriptors:  pT1a  pN not assigned  FIGO IA   Problem List: Patient Active Problem List   Diagnosis Date Noted  . Bipolar 1 disorder, manic, full remission ([place]) 03/30/2020  . Cyst of ovary 03/30/2020  . Mixed hyperlipidemia 03/30/2020  . RBBB 03/30/2020  . Falls 01/05/2020  . Type 2 diabetes mellitus ([place]) 01/05/2020  . Tremor of both hands 09/16/2019  . Multinodular goiter 10/24/2018  . Atrophic kidney, acquired 05/02/2017  . Incomplete emptying of bladder 05/02/2017  . Overactive detrusor 05/02/2017  . Benign essential HTN 10/23/2016  . Chronic kidney disease, stage 3 unspecified ([place]) 04/10/2016  . LVH (left ventricular hypertrophy) due to hypertensive disease, without heart failure 04/10/2016  . Idiopathic peripheral neuropathy 06/01/2015  . Arthritis involving multiple sites 03/24/2014  . Depressed bipolar I disorder ([place]) 03/24/2014  .  Personal history of other malignant neoplasm of skin 12/22/2013    Past Medical History: Past Medical History:  Diagnosis Date  . Arthritis    osteo, multiple sites  . Bipolar 1 disorder, mixed ([place])    . Cancer ([place])    skin-malignant neoplasm,scc  . Chronic kidney disease   . Cyst of ovary   . Dysrhythmia    left ventricular systolic dysfunction  . HOH (hard of hearing)    right ear  . Hyperlipidemia   . Hypertension   . Lordosis of lumbar region    kyphosis  . Peripheral neuropathy    balls of feet  . RBBB (right bundle branch block)    Dr Gareth Eagle [phone]  . Serum lipids high   . UTI (urinary tract infection)    June at [facility]  . Vertigo    dental office chairs    Past Surgical History: Past Surgical History:  Procedure Laterality Date  . BREAST BIOPSY Right years ago   2 bx. done. Negative results, benign lumpectomy  . COLONOSCOPY  [id number]   diverticulosis  . COLONOSCOPY WITH PROPOFOL N/A 09/26/2017   Procedure: COLONOSCOPY WITH PROPOFOL;  Surgeon: Lollie Sails, MD;  Location: [facility];  Service: Endoscopy;  Laterality: N/A;  . EYE SURGERY Bilateral [id number]   cataract, right then left  . FOOT SURGERY Right 2014   bunionectomy with hammertoe  . FOREIGN BODY REMOVAL Left 12/04/2018   Procedure: F/B REMOVAL, COMPLICATED;  Surgeon: Albertine Patricia, DPM;  Location: [place];  Service: Podiatry;  Laterality: Left;   removed hammerlock implant and screw x 1  . HAMMER TOE SURGERY Left 04/27/2015   Procedure: HAMMER TOE CORRECTION 2ND TOE;  Surgeon: [name], DPM;  Location: [place];  Service: Podiatry;  Laterality: Left;  . LAPAROSCOPIC BILATERAL SALPINGO OOPHERECTOMY Bilateral 02/08/2020   Procedure: LAPAROSCOPIC BILATERAL SALPINGO OOPHORECTOMY, ABDOMINAL AHESIONS LYSIS;  Surgeon: Schermerhorn, Gwen Her, MD;  Location: [facility] OR[facility];  Service: Gynecology;  Laterality: Bilateral;  . METATARSAL OSTEOTOMY Left 04/27/2015   Procedure: METATARSAL OSTEOTOMY 1ST METATARSAL AND Proximal phalanx osteotomy.;  Surgeon: [name], DPM;  Location: [place];  Service: Podiatry;  Laterality: Left;  . MOHS SURGERY  07/10/2018    face and right forearm  . RECONSTRUCTION OF ANGULAR DEFORMITY,TOE Left 12/04/2018   Procedure: RECONSTRUCTION OVERLAPPING TOE LEFT;  Surgeon: Albertine Patricia, DPM;  Location: [place];  Service: Podiatry;  Laterality: Left;  . TUBAL LIGATION       Family History: Family History  Problem Relation Age of Onset  . Breast cancer Sister 31    Social History: Social History   Socioeconomic History  . Marital status: Divorced    Spouse name: Not on file  . Number of children: Not on file  . Years of education: Not on file  . Highest education level: Not on file  Occupational History  . Not on file  Tobacco Use  . Smoking status: Never Smoker  . Smokeless tobacco: Never Used  Vaping Use  . Vaping Use: Never used  Substance and Sexual Activity  . Alcohol use: Yes    Comment: 1 drink/week  . Drug use: Never  . Sexual activity: Not on file  Other Topics Concern  . Not on file  Social History Narrative  . Not on file   Social Determinants of Health   Financial Resource Strain:   . Difficulty of Paying Living Expenses: Not on file  Food Insecurity:   .  Worried About Charity fundraiser in the Last Year: Not on file  . Ran Out of Food in the Last Year: Not on file  Transportation Needs:   . Lack of Transportation (Medical): Not on file  . Lack of Transportation (Non-Medical): Not on file  Physical Activity:   . Days of Exercise per Week: Not on file  . Minutes of Exercise per Session: Not on file  Stress:   . Feeling of Stress : Not on file  Social Connections:   . Frequency of Communication with Friends and Family: Not on file  . Frequency of Social Gatherings with Friends and Family: Not on file  . Attends Religious Services: Not on file  . Active Member of Clubs or Organizations: Not on file  . Attends Archivist Meetings: Not on file  . Marital Status: Not on file  Intimate Partner Violence:   . Fear of Current or Ex-Partner: Not on file  .  Emotionally Abused: Not on file  . Physically Abused: Not on file  . Sexually Abused: Not on file    Allergies: Allergies  Allergen Reactions  . Latex Rash  . Mangifera Indica Anaphylaxis  . Other Swelling    Mango skin - swelling of lips  . Shellfish-Derived Products Hives    shrimp  . Adhesive [Tape] Rash    silicone    Current Medications: Current Outpatient Medications  Medication Sig Dispense Refill  . Artificial Saliva (BIOTENE MOISTURIZING MOUTH MT) Use as directed 10 mLs in the mouth or throat 2 (two) times daily as needed (dry mouth).     Marland Kitchen aspirin 81 MG tablet Take 81 mg by mouth at bedtime.    . Coenzyme Q10 (CO Q10) 100 MG CAPS Take 200 mg by mouth daily.    . diclofenac Sodium (VOLTAREN) 1 % GEL Apply 2 g topically 4 (four) times daily as needed (pain).     Marland Kitchen divalproex (DEPAKOTE ER) 500 MG 24 hr tablet Take 500 mg by mouth at bedtime.     . Flaxseed, Linseed, (FLAXSEED OIL) 1000 MG CAPS Take 1,000 mg by mouth daily.    Marland Kitchen gabapentin (NEURONTIN) 300 MG capsule Take 300 mg by mouth at bedtime. Take two capsules 300 mg at bedtime    . losartan (COZAAR) 25 MG tablet Take 25 mg by mouth daily.    Marland Kitchen MAGNESIUM CITRATE PO Take 250 mg by mouth at bedtime.     . mirabegron ER (MYRBETRIQ) 25 MG TB24 tablet Take 25 mg by mouth daily.    . polyethylene glycol (MIRALAX / GLYCOLAX) 17 g packet Take 17 g by mouth at bedtime.    . pravastatin (PRAVACHOL) 40 MG tablet Take 40 mg by mouth at bedtime.    . Probiotic Product (ALIGN) 4 MG CAPS Take 4 mg by mouth every evening.    Marland Kitchen QUEtiapine (SEROQUEL) 50 MG tablet Take 50 mg by mouth at bedtime.    . sodium fluoride (PREVIDENT 5000 DRY MOUTH) 1.1 % GEL dental gel Place 1 application onto teeth in the morning and at bedtime.     . Wheat Dextrin (BENEFIBER PO) Take 1 Package by mouth daily.    Marland Kitchen acetaminophen (TYLENOL) 325 MG tablet Take 650 mg by mouth every 6 (six) hours as needed for moderate pain. (Patient not taking: Reported on  02/08/2020)    . Biotin 5 MG TABS Take by mouth.    . gabapentin (NEURONTIN) 100 MG capsule Take 200 mg by mouth at  bedtime.  (Patient not taking: Reported on 02/02/2020)     No current facility-administered medications for this visit.    General: negative for fevers, changes in weight or night sweats Skin: negative for changes in moles or sores or rash Eyes: negative for changes in vision HEENT: negative for change in hearing, tinnitus, voice changes Pulmonary: negative for dyspnea, orthopnea, productive cough, wheezing Cardiac: negative for palpitations, pain Gastrointestinal: negative for nausea, vomiting, constipation, diarrhea, hematemesis, hematochezia Genitourinary/Sexual: negative for dysuria, retention, hematuria, incontinence Ob/Gyn:  negative for abnormal bleeding, or pain Musculoskeletal: negative for pain, joint pain, back pain Hematology: negative for easy bruising, abnormal bleeding Neurologic/Psych: negative for headaches, seizures, paralysis, weakness, numbness  Objective:  Physical Examination:  BP (!) 141/63   Pulse (!) 58   Temp 98.5 F (36.9 C) (Oral)   Resp 16   Wt 134 lb 1.6 oz (60.8 kg)   SpO2 100%   BMI 25.34 kg/m     ECOG Performance Status: 0 - Asymptomatic  GENERAL: Patient is a well appearing female in no acute distress HEENT:  PERRL, neck supple with midline trachea. Thyroid without masses.  NODES:  No cervical, supraclavicular, axillary, or inguinal lymphadenopathy palpated.  LUNGS:  Clear to auscultation bilaterally.  No wheezes or rhonchi. HEART:  Regular rate and rhythm. No murmur appreciated. ABDOMEN:  Soft, nontender.  Positive, normoactive bowel sounds. Incisions well healed.  MSK:  No focal spinal tenderness to palpation. Full range of motion bilaterally in the upper extremities. EXTREMITIES:  No peripheral edema.   SKIN:  Clear with no obvious rashes or skin changes. No nail dyscrasia. NEURO:  Nonfocal. Well oriented.  Appropriate  affect.  Pelvic: Chaperoned by Nursing EGBUS: no lesions Cervix: no lesions, nontender, mobile Vagina: no lesions, no discharge or bleeding Uterus: normal size, nontender, mobile Adnexa: no palpable masses Rectovaginal: confirmatory  Lab Review Labs on site today:  Assessment:  Tracey Wilson is a 74 y.o. 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 This adnexa was grossly normal.  Surgery with LS BSO was done due to 5 cm cystic mass in left ovary that was mucinous cystadenoma/dermoid. Preop HE4 131 was elevated, but this was probably due to chronic kidney disease.  CA125 was normal (18).   Normal exam today.  Medical co-morbidities complicating care: CKD.  Plan:   Problem List Items Addressed This Visit      Endocrine   Borderline serous cystadenoma of right ovary ([place])      We discussed options for management including surveillance.  The risk of recurrence of a small serous borderline ovarian tumor is very low.  In view of this, no further surgery or treatment is indicated.  She can follow up with Dr Ouida Sills q47months with CA125 monitoring.  We can see her back in the future in the unlikely event any problems arise.    The patient's diagnosis, an outline of the further diagnostic and laboratory studies which will be required, the recommendation for surgery, and alternatives were discussed with her and her accompanying family members.  All questions were answered to their satisfaction.  A total of 40 minutes were spent with the patient/family today; 50% was spent in education, counseling and coordination of care for serous borderline tumor.    Verlon Au, NP  I personally interviewed and examined the patient. Agreed with the above/below plan of care. I have directly contributed to assessment and plan of care of this patient and educated  and discussed with patient and family.  Mellody Drown, MD     CC:   Schermerhorn, Gwen Her, MD [address] [facility] [place],  [place] [address] [phone]

## 2020-11-09 ENCOUNTER — Other Ambulatory Visit: Payer: Self-pay | Admitting: Family Medicine

## 2020-11-09 DIAGNOSIS — Z1231 Encounter for screening mammogram for malignant neoplasm of breast: Secondary | ICD-10-CM

## 2020-12-12 ENCOUNTER — Ambulatory Visit
Admission: RE | Admit: 2020-12-12 | Discharge: 2020-12-12 | Disposition: A | Discharge status: Home / Self Care | Payer: Medicare Other | Source: Ambulatory Visit | Attending: Family Medicine | Admitting: Family Medicine

## 2020-12-12 ENCOUNTER — Other Ambulatory Visit: Payer: Self-pay

## 2020-12-12 DIAGNOSIS — Z1231 Encounter for screening mammogram for malignant neoplasm of breast: Secondary | ICD-10-CM | POA: Insufficient documentation

## 2021-01-10 ENCOUNTER — Other Ambulatory Visit: Payer: Self-pay | Admitting: Surgery

## 2021-01-10 DIAGNOSIS — E042 Nontoxic multinodular goiter: Secondary | ICD-10-CM

## 2021-01-22 ENCOUNTER — Telehealth: Payer: Self-pay | Admitting: Adult Health

## 2021-01-22 DIAGNOSIS — M25562 Pain in left knee: Secondary | ICD-10-CM

## 2021-01-22 MED ORDER — TRAMADOL HCL 50 MG PO TABS: 50.0000 mg | ORAL_TABLET | Freq: Two times a day (BID) | ORAL | 0 refills | Status: AC | PRN

## 2021-01-22 NOTE — Telephone Encounter (Signed)
Nurse called to report left knee pain unrelieved by Tylenol. Pt apparently fell at home and is a new admission to [facility]  Will obtain 2 view xray of the left knee and ordered Ultram 50 mg bid prn x 3 days only.

## 2021-01-23 ENCOUNTER — Encounter: Payer: Self-pay | Admitting: Internal Medicine

## 2021-01-23 ENCOUNTER — Non-Acute Institutional Stay (SKILLED_NURSING_FACILITY): Payer: Medicare Other | Admitting: Internal Medicine

## 2021-01-23 DIAGNOSIS — W19XXXD Unspecified fall, subsequent encounter: Secondary | ICD-10-CM

## 2021-01-23 DIAGNOSIS — N390 Urinary tract infection, site not specified: Secondary | ICD-10-CM | POA: Diagnosis not present

## 2021-01-23 DIAGNOSIS — U071 COVID-19: Secondary | ICD-10-CM | POA: Insufficient documentation

## 2021-01-23 DIAGNOSIS — N1832 Chronic kidney disease, stage 3b: Secondary | ICD-10-CM

## 2021-01-23 NOTE — Assessment & Plan Note (Signed)
Asymptomatic at this time in reference to GU review of systems

## 2021-01-23 NOTE — Progress Notes (Signed)
NURSING HOME LOCATION:  [facility] ROOM NUMBER:  [id number]  CODE STATUS:  Full Code  PCP:  Thereasa Distance MD  This is a comprehensive admission note to this SNFperformed on this date less than 30 days from date of admission. Included are preadmission medical/surgical history; reconciled medication list; family history; social history and comprehensive review of systems.  Corrections and additions to the records were documented. Comprehensive physical exam was also performed. Additionally a clinical summary was entered for each active diagnosis pertinent to this admission in the Problem List to enhance continuity of care.  HPI: Patient was hospitalized 7/19 - 01/20/2021 presenting to [facility] with generalized weakness with recurrent falls.  Acute COVID infection & UTI were documented. She denied any head trauma or loss of consciousness.  Despite absence of UTI symptoms urine culture was positive for Citrobacter koseri.  She received 3 days of nitrofurantoin with change to cephalexin x 5 days for 7/22-7/26. A1c had been performed 09/21/2020 with A1c of 6.3% indicating diet-controlled prediabetes.. PCR positive 7/18.She had received a single J&J vaccine 10/10/2019,but no additional vaccination.  Imaging revealed no infiltrates.  She did desaturate to 84% on room air at presentation but was stable on supplemental oxygen 1-2 L/min throughout hospitalization.  Despite the desaturation she was not given any COVID-specific therapies.  O2 goal was recommended to be 90-92% or greater.  Isolation through 7/27 was recommended. CRP was WNL; no D dimer reported. CKD stage III was at baseline at presentation. Seroquel, Depakote, gabapentin were continued for the bipolar disorder. PT/OT recommended discharge to SNF because of the generalized weakness.  Past medical and surgical history: Includes history of skin cancer, bipolar 1 disorder, CKD, dyslipidemia, essential hypertension, peripheral  neuropathy, and history of vertigo. Surgeries and procedures include breast biopsy, colonoscopy, BSO, and Mohs surgery.  Social history: Occasional alcohol intake; never smoked.Retired Pharmacist, hospital for [employer]  Family history: Limited history reviewed; noncontributory due to advanced age.   Review of systems: She states that she had no cardiac or neurologic prodrome prior to 2 falls the day of admission.  She did experience slight vertigo with the first fall.  She states that she was on the floor from 6:30 AM until 8:30 PM when her IT technician found her . She relates her weakness to having had inadequate sleep the night prior to the fall.  She states that she takes her antipsychotic drugs and usually sleeps for 12 hours but did not do so that night. She stated her cough began 7/22 after arrival here and has been productive of clear-yellow sputum. She was to have an ultrasound through the [facility] system 7/26 to evaluate thyroid asymmetry.  She denies a history of nodules. She describes some pain in her neck when she looks up since the fall. COVID review of symptoms is negative except for the cough. She complained of R knee pain 7/23; imaging revealed OA w/o fracture.  Constitutional: No fever, significant weight change  Eyes: No redness, discharge, pain, vision change ENT/mouth: No nasal congestion, purulent discharge, earache, change in hearing, sore throat  Cardiovascular: No chest pain, palpitations, paroxysmal nocturnal dyspnea, claudication, edema  Respiratory: No hemoptysis, DOE, significant snoring, apnea  Gastrointestinal: No heartburn, dysphagia, abdominal pain, nausea /vomiting, rectal bleeding, melena, change in bowels Genitourinary: No dysuria, hematuria, pyuria, incontinence, nocturia Musculoskeletal: No joint stiffness, joint swelling Dermatologic: No rash, pruritus, change in appearance of skin other than carpet burns on knees & elbows. Neurologic: No dizziness, headache,  syncope, seizures,  numbness, tingling Psychiatric: No significant anxiety, depression, anorexia Endocrine: No change in hair/skin/nails, excessive thirst, excessive hunger, excessive urination  Hematologic/lymphatic: No significant bruising, lymphadenopathy, abnormal bleeding Allergy/immunology: No itchy/watery eyes, significant sneezing, urticaria, angioedema  Physical exam:  Pertinent or positive findings: She is an excellent historian and very intelligent.  Eyebrows are decreased.  Dentition is immaculate.  Chest is clear to auscultation. She did not cough during the exam.Pedal pulses are decreased.  There is a slight eschar at the left elbow and a dressing over the right elbow  General appearance: Adequately nourished; no acute distress, increased work of breathing is present.   Lymphatic: No lymphadenopathy about the head, neck, axilla. Eyes: No conjunctival inflammation or lid edema is present. There is no scleral icterus. Ears:  External ear exam shows no significant lesions or deformities.   Nose:  External nasal examination shows no deformity or inflammation. Nasal mucosa are pink and moist without lesions, exudates Oral exam: Lips and gums are healthy appearing.There is no oropharyngeal erythema or exudate. Neck:  No thyromegaly, masses, tenderness noted.    Heart:  Normal rate and regular rhythm. S1 and S2 normal without gallop, murmur, click, rub.  Lungs:  without wheezes, rhonchi, rales, rubs. Abdomen: Bowel sounds are normal.  Abdomen is soft and nontender with no organomegaly, hernias, masses. GU: Deferred  Extremities:  No cyanosis, clubbing, edema. Neurologic exam:  Balance, Rhomberg, finger to nose testing could not be completed due to clinical state Skin: Warm & dry w/o tenting. No significant  rash.  See clinical summary under each active problem in the Problem List with associated updated therapeutic plan

## 2021-01-23 NOTE — Assessment & Plan Note (Signed)
01/20/2021 Creatinine 1.63/GFR 33,CKD Stage 3b Medication List reviewed; no nephrotoxic agents identified. If CKD progresses  ARB will be weaned or discontinued.

## 2021-01-23 NOTE — Patient Instructions (Signed)
See assessment and plan under each diagnosis in the problem list and acutely for this visit 

## 2021-01-24 ENCOUNTER — Ambulatory Visit: Payer: Medicare Other

## 2021-01-30 ENCOUNTER — Encounter: Payer: Self-pay | Admitting: Adult Health

## 2021-01-30 ENCOUNTER — Non-Acute Institutional Stay (SKILLED_NURSING_FACILITY): Payer: Medicare Other | Admitting: Adult Health

## 2021-01-30 DIAGNOSIS — E1122 Type 2 diabetes mellitus with diabetic chronic kidney disease: Secondary | ICD-10-CM | POA: Diagnosis not present

## 2021-01-30 DIAGNOSIS — W19XXXD Unspecified fall, subsequent encounter: Secondary | ICD-10-CM

## 2021-01-30 DIAGNOSIS — I10 Essential (primary) hypertension: Secondary | ICD-10-CM

## 2021-01-30 DIAGNOSIS — F319 Bipolar disorder, unspecified: Secondary | ICD-10-CM

## 2021-01-30 DIAGNOSIS — N1832 Chronic kidney disease, stage 3b: Secondary | ICD-10-CM

## 2021-01-30 NOTE — Progress Notes (Signed)
Location:  [place] Room Number: 307 A Place of Service:  SNF (31) Provider:  Durenda Age, DNP, FNP-BC  Patient Care Team: [name], MD as PCP - General [facility] Medicine)  Extended Emergency Contact Information Primary Emergency Contact: [facility] Address: [address]          [place], [place] [address] [name] of [place] Phone: [phone] Relation: Son Secondary Emergency Contact: Danis,Kenneth Address: [place], [place] [address] [name] of [place] Mobile Phone: [phone] Relation: Son  Code Status:  FULL CODE  Goals of care: Advanced Directive information Advanced Directives 01/30/2021  Does Patient Have a Medical Advance Directive? Yes  Type of Paramedic of [place];Living will  Does patient want to make changes to medical advance directive? No - Patient declined  Copy of [place] in Chart? Yes - validated most recent copy scanned in chart (See row information)     Chief Complaint  Patient presents with   Acute Visit    Short term    HPI:  Pt is a 75 y.o. female seen today for a short-term care visit. She is currently having short-term rehabilitation post hospitalization  01/17/21 to 01/20/21 at [facility] with generalized weakness and weak and recurrent falls.  She was treated for COVID-19 infection and UTI.  She had received a single J&J vaccine on 04/2020.  She was given supplemental oxygen 1 to 2 L/minute throughout hospitalization.  Her UTI was treated with 3 days of nitrofurantoin then changed to cephalexin x5 days.  She was seen in her room today. She denies coughing nor SOB.  SBPs ranging from 109-140.  She thinks losartan 25 mg daily for hypertension.  She takes gabapentin 600 mg at 6 PM, quetiapine fumarate 50 mg at bedtime and divalproex sodium ER 500 mg at bedtime for bipolar disorder.  No reported hallucinations no agitation.   Past Medical  History:  Diagnosis Date   Arthritis    osteo, multiple sites   Bipolar 1 disorder, mixed ([place])    Cancer ([place])    skin-malignant neoplasm,scc   Chronic kidney disease    Cyst of ovary    Dysrhythmia    left ventricular systolic dysfunction   HOH (hard of hearing)    right ear   Hyperlipidemia    Hypertension    Lordosis of lumbar region    kyphosis   Peripheral neuropathy    balls of feet   RBBB (right bundle branch block)    Dr Gareth Eagle [phone]   UTI (urinary tract infection)    June at [facility] ER   Vertigo    dental office chairs   Past Surgical History:  Procedure Laterality Date   BREAST BIOPSY Right years ago   2 bx. done. Negative results, benign lumpectomy   COLONOSCOPY  2010,2013   diverticulosis   COLONOSCOPY WITH PROPOFOL N/A 09/26/2017   Procedure: COLONOSCOPY WITH PROPOFOL;  Surgeon: Lollie Sails, MD;  Location: [facility] ENDOSCOPY;  Service: Endoscopy;  Laterality: N/A;   EYE SURGERY Bilateral [phone]   cataract, right then left   FOOT SURGERY Right 2014   bunionectomy with hammertoe   FOREIGN BODY REMOVAL Left 12/04/2018   Procedure: F/B REMOVAL, COMPLICATED;  Surgeon: Albertine Patricia, DPM;  Location: [place];  Service: Podiatry;  Laterality: Left;   removed hammerlock implant and screw x 1   HAMMER TOE SURGERY Left 04/27/2015  Procedure: HAMMER TOE CORRECTION 2ND TOE;  Surgeon: [name], DPM;  Location: [place];  Service: Podiatry;  Laterality: Left;   LAPAROSCOPIC BILATERAL SALPINGO OOPHERECTOMY Bilateral 02/08/2020   Procedure: LAPAROSCOPIC BILATERAL SALPINGO OOPHORECTOMY, ABDOMINAL AHESIONS LYSIS;  Surgeon: Schermerhorn, Gwen Her, MD;  Location: [facility] ORS;  Service: Gynecology;  Laterality: Bilateral;   METATARSAL OSTEOTOMY Left 04/27/2015   Procedure: METATARSAL OSTEOTOMY 1ST METATARSAL AND Proximal phalanx osteotomy.;  Surgeon: [name], DPM;  Location: [place];  Service: Podiatry;  Laterality: Left;    MOHS SURGERY  07/10/2018   face and right forearm   RECONSTRUCTION OF ANGULAR DEFORMITY,TOE Left 12/04/2018   Procedure: RECONSTRUCTION OVERLAPPING TOE LEFT;  Surgeon: Albertine Patricia, DPM;  Location: [place];  Service: Podiatry;  Laterality: Left;   TUBAL LIGATION      Allergies  Allergen Reactions   Latex Rash   Mangifera Indica Anaphylaxis   Other Swelling    Mango skin - swelling of lips   Shellfish-Derived Products Hives    shrimp   Adhesive [Tape] Rash    silicone    Outpatient Encounter Medications as of 01/30/2021  Medication Sig   acetaminophen (TYLENOL) 325 MG tablet Take 650 mg by mouth every 6 (six) hours as needed for moderate pain.   Artificial Saliva (BIOTENE MOISTURIZING MOUTH MT) Use as directed 10 mLs in the mouth or throat 2 (two) times daily as needed (dry mouth).    aspirin 81 MG tablet Take 81 mg by mouth at bedtime.   cephALEXin (KEFLEX) 500 MG capsule Take 500 mg by mouth every 6 (six) hours. X 4 days   Coenzyme Q10 (CO Q10) 100 MG CAPS Take 200 mg by mouth daily.   diclofenac Sodium (VOLTAREN) 1 % GEL Apply 2 g topically 4 (four) times daily as needed (pain).    divalproex (DEPAKOTE ER) 500 MG 24 hr tablet Take 500 mg by mouth at bedtime.    Ensure (ENSURE) Take 237 mLs by mouth.   gabapentin (NEURONTIN) 300 MG capsule Take 600 mg by mouth at bedtime.   Lactobacillus (LACTINEX PO) Take 1 packet by mouth daily.   losartan (COZAAR) 25 MG tablet Take 25 mg by mouth daily.   MAGNESIUM CITRATE PO Take 250 mg by mouth at bedtime.    Menthol 1 MG LOZG Use as directed 1 lozenge in the mouth or throat every 2 (two) hours as needed (for cough).   mirabegron ER (MYRBETRIQ) 25 MG TB24 tablet Take 25 mg by mouth daily.   polyethylene glycol (MIRALAX / GLYCOLAX) 17 g packet Take 17 g by mouth daily.   pravastatin (PRAVACHOL) 40 MG tablet Take 40 mg by mouth at bedtime.   QUEtiapine (SEROQUEL) 50 MG tablet Take 50 mg by mouth at bedtime.   sodium fluoride  (FLUORISHIELD) 1.1 % GEL dental gel Place 1 application onto teeth in the morning and at bedtime.    No facility-administered encounter medications on file as of 01/30/2021.    Review of Systems  GENERAL: No change in appetite, no fatigue, no weight changes, no fever, chills or weakness MOUTH and THROAT: Denies oral discomfort, gingival pain or bleeding RESPIRATORY: no cough, SOB, DOE, wheezing, hemoptysis CARDIAC: No chest pain, edema or palpitations GI: No abdominal pain, diarrhea, constipation, heart burn, nausea or vomiting GU: Denies dysuria, frequency, hematuria, incontinence, or discharge NEUROLOGICAL: Denies dizziness, syncope, numbness, or headache PSYCHIATRIC: Denies feelings of depression or anxiety. No report of hallucinations, insomnia, paranoia, or agitation   Immunization History  Administered Date(s) Administered   Fluad Quad(high Dose 65+) 03/12/2019   H1N1 07/27/2008   Influenza Split 06/11/2008   Influenza, High Dose Seasonal PF 06/21/2009, 04/09/2017, 04/01/2018   Influenza-Unspecified 04/03/2012, 03/03/2014, 04/04/2015, 04/10/2016, 04/01/2018, 03/12/2019, 03/12/2020   Janssen (J&J) SARS-COV-2 Vaccination 10/10/2019   Pneumococcal Conjugate-13 08/03/2015   Pneumococcal Polysaccharide-23 06/21/2009   Tdap 01/22/2014   Zoster Recombinat (Shingrix) 12/28/2016, 02/28/2017   Pertinent  Health Maintenance Due  Topic Date Due   HEMOGLOBIN A1C  Never done   FOOT EXAM  Never done   OPHTHALMOLOGY EXAM  Never done   INFLUENZA VACCINE  01/30/2021   DEXA SCAN  Completed   PNA vac Low Risk Adult  Completed   No flowsheet data found.   Vitals:   01/30/21 1025  BP: 121/77  Pulse: 88  Resp: 18  Temp: (!) 97 F (36.1 C)  Height: '5\' 1"'$  (1.549 m)   Body mass index is 25.34 kg/m.  Physical Exam  GENERAL APPEARANCE: Well nourished. In no acute distress. Normal body habitus SKIN:  Skin is warm and dry.  MOUTH and THROAT: Lips are without lesions. Oral mucosa is  moist and without lesions.  RESPIRATORY: Breathing is even & unlabored, BS CTAB CARDIAC: RRR, no murmur,no extra heart sounds, no edema GI: Abdomen soft, normal BS, no masses, no tenderness EXTREMITIES:  Able to move X 4 extremities NEUROLOGICAL: There is no tremor. Speech is clear. Alert and oriented X 3. PSYCHIATRIC:  Affect and behavior are appropriate  Labs reviewed: Recent Labs    02/05/20 0936  NA 139  K 3.9  CL 104  CO2 27  GLUCOSE 104*  BUN 22  CREATININE 1.23*  CALCIUM 9.6    Recent Labs    02/05/20 0936  WBC 5.2  HGB 11.7*  HCT 36.5  MCV 91.0  PLT 191    Assessment/Plan  1. Primary hypertension -  BPs stable, continue Losartan -   monitor BPs  2. Type 2 diabetes mellitus with stage 3b chronic kidney disease, without long-term current use of insulin ([place]) -  92 to 171, with outlier 285 -   diet-controlled  3. Depressed bipolar I disorder (HCC) -   mood is stable, continue Divalproex Sod ER, Gabapentin and Quetiapine fumarate  4. Fall, subsequent encounter -  no fall incident at the facility -   currently having PT and OT, for therapeutic strengthening exercises and fall precautions     Family/ staff Communication:   Discussed plan of care with resident and charge nurse.  Labs/tests ordered:  None  Goals of care:   Short-term care   Durenda Age, DNP, MSN, FNP-BC [facility] and Adult Medicine [phone] (Monday-Friday 8:00 a.m. - 5:00 p.m.) [phone] (after hours)

## 2021-01-30 NOTE — Progress Notes (Deleted)
Location:  [place] Room Number: 307 A Place of Service:  SNF (31) Provider:  Durenda Age, DNP, FNP-BC  Patient Care Team: [name], MD as PCP - General [facility] Medicine)  Extended Emergency Contact Information Primary Emergency Contact: [facility] Address: [address]          [place], [place] [address] [name] of [place] Phone: [phone] Relation: Son Secondary Emergency Contact: Hass,Kenneth Address: [place], [place] [address] [name] of [place] Mobile Phone: [phone] Relation: Son  Code Status:  FULL CODE  Goals of care: Advanced Directive information Advanced Directives 01/30/2021  Does Patient Have a Medical Advance Directive? Yes  Type of Paramedic of [place];Living will  Does patient want to make changes to medical advance directive? No - Patient declined  Copy of [place] in Chart? Yes - validated most recent copy scanned in chart (See row information)     Chief Complaint  Patient presents with   Acute Visit    Short term    HPI:  Pt is a 75 y.o. female seen today for medical management of chronic diseases.     Past Medical History:  Diagnosis Date   Arthritis    osteo, multiple sites   Bipolar 1 disorder, mixed ([place])    Cancer ([place])    skin-malignant neoplasm,scc   Chronic kidney disease    Cyst of ovary    Dysrhythmia    left ventricular systolic dysfunction   HOH (hard of hearing)    right ear   Hyperlipidemia    Hypertension    Lordosis of lumbar region    kyphosis   Peripheral neuropathy    balls of feet   RBBB (right bundle branch block)    Dr Gareth Eagle [phone]   UTI (urinary tract infection)    June at [facility] ER   Vertigo    dental office chairs   Past Surgical History:  Procedure Laterality Date   BREAST BIOPSY Right years ago   2 bx. done. Negative results, benign lumpectomy   COLONOSCOPY   2010,2013   diverticulosis   COLONOSCOPY WITH PROPOFOL N/A 09/26/2017   Procedure: COLONOSCOPY WITH PROPOFOL;  Surgeon: Lollie Sails, MD;  Location: [facility] ENDOSCOPY;  Service: Endoscopy;  Laterality: N/A;   EYE SURGERY Bilateral [phone]   cataract, right then left   FOOT SURGERY Right 2014   bunionectomy with hammertoe   FOREIGN BODY REMOVAL Left 12/04/2018   Procedure: F/B REMOVAL, COMPLICATED;  Surgeon: Albertine Patricia, DPM;  Location: [place];  Service: Podiatry;  Laterality: Left;   removed hammerlock implant and screw x 1   HAMMER TOE SURGERY Left 04/27/2015   Procedure: HAMMER TOE CORRECTION 2ND TOE;  Surgeon: [name], DPM;  Location: [place];  Service: Podiatry;  Laterality: Left;   LAPAROSCOPIC BILATERAL SALPINGO OOPHERECTOMY Bilateral 02/08/2020   Procedure: LAPAROSCOPIC BILATERAL SALPINGO OOPHORECTOMY, ABDOMINAL AHESIONS LYSIS;  Surgeon: Schermerhorn, Gwen Her, MD;  Location: [facility] ORS;  Service: Gynecology;  Laterality: Bilateral;   METATARSAL OSTEOTOMY Left 04/27/2015   Procedure: METATARSAL OSTEOTOMY 1ST METATARSAL AND Proximal phalanx osteotomy.;  Surgeon: [name], DPM;  Location: [place];  Service: Podiatry;  Laterality: Left;   MOHS SURGERY  07/10/2018   face and right forearm   RECONSTRUCTION OF ANGULAR DEFORMITY,TOE Left 12/04/2018   Procedure: RECONSTRUCTION OVERLAPPING TOE LEFT;  Surgeon: Albertine Patricia, DPM;  Location: [place]  [facility];  Service: Podiatry;  Laterality: Left;   TUBAL LIGATION      Allergies  Allergen Reactions   Latex Rash   Mangifera Indica Anaphylaxis   Other Swelling    Mango skin - swelling of lips   Shellfish-Derived Products Hives    shrimp   Adhesive [Tape] Rash    silicone    Outpatient Encounter Medications as of 01/30/2021  Medication Sig   acetaminophen (TYLENOL) 325 MG tablet Take 650 mg by mouth every 6 (six) hours as needed for moderate pain.   Artificial Saliva (BIOTENE  MOISTURIZING MOUTH MT) Use as directed 10 mLs in the mouth or throat 2 (two) times daily as needed (dry mouth).    aspirin 81 MG tablet Take 81 mg by mouth at bedtime.   cephALEXin (KEFLEX) 500 MG capsule Take 500 mg by mouth every 6 (six) hours. X 4 days   Coenzyme Q10 (CO Q10) 100 MG CAPS Take 200 mg by mouth daily.   diclofenac Sodium (VOLTAREN) 1 % GEL Apply 2 g topically 4 (four) times daily as needed (pain).    divalproex (DEPAKOTE ER) 500 MG 24 hr tablet Take 500 mg by mouth at bedtime.    Ensure (ENSURE) Take 237 mLs by mouth.   gabapentin (NEURONTIN) 300 MG capsule Take 600 mg by mouth at bedtime.   Lactobacillus (LACTINEX PO) Take 1 packet by mouth daily.   losartan (COZAAR) 25 MG tablet Take 25 mg by mouth daily.   MAGNESIUM CITRATE PO Take 250 mg by mouth at bedtime.    Menthol 1 MG LOZG Use as directed 1 lozenge in the mouth or throat every 2 (two) hours as needed (for cough).   mirabegron ER (MYRBETRIQ) 25 MG TB24 tablet Take 25 mg by mouth daily.   polyethylene glycol (MIRALAX / GLYCOLAX) 17 g packet Take 17 g by mouth daily.   pravastatin (PRAVACHOL) 40 MG tablet Take 40 mg by mouth at bedtime.   QUEtiapine (SEROQUEL) 50 MG tablet Take 50 mg by mouth at bedtime.   sodium fluoride (FLUORISHIELD) 1.1 % GEL dental gel Place 1 application onto teeth in the morning and at bedtime.    No facility-administered encounter medications on file as of 01/30/2021.    Review of Systems  GENERAL: No change in appetite, no fatigue, no weight changes, no fever, chills or weakness SKIN: Denies rash, itching, wounds, ulcer sores, or nail abnormalities EYES: Denies change in vision, dry eyes, eye pain, itching or discharge EARS: Denies change in hearing, ringing in ears, or earache NOSE: Denies nasal congestion or epistaxis MOUTH and THROAT: Denies oral discomfort, gingival pain or bleeding, pain from teeth or hoarseness   RESPIRATORY: no cough, SOB, DOE, wheezing, hemoptysis CARDIAC: No chest  pain, edema or palpitations GI: No abdominal pain, diarrhea, constipation, heart burn, nausea or vomiting GU: Denies dysuria, frequency, hematuria, incontinence, or discharge MUSCULOSKELETAL: Denies joint pain, muscle pain, back pain, restricted movement, or unusual weakness CIRCULATION: Denies claudication, edema of legs, varicosities, or cold extremities NEUROLOGICAL: Denies dizziness, syncope, numbness, or headache PSYCHIATRIC: Denies feelings of depression or anxiety. No report of hallucinations, insomnia, paranoia, or agitation ENDOCRINE: Denies polyphagia, polyuria, polydipsia, heat or cold intolerance HEME/LYMPH: Denies excessive bruising, petechia, enlarged lymph nodes, or bleeding problems IMMUNOLOGIC: Denies history of frequent infections, AIDS, or use of immunosuppressive agents   Immunization History  Administered Date(s) Administered   Fluad Quad(high Dose 65+) 03/12/2019   H1N1 07/27/2008   Influenza Split 06/11/2008   Influenza, High Dose Seasonal  PF 06/21/2009, 04/09/2017, 04/01/2018   Influenza-Unspecified 04/03/2012, 03/03/2014, 04/04/2015, 04/10/2016, 04/01/2018, 03/12/2019, 03/12/2020   Janssen (J&J) SARS-COV-2 Vaccination 10/10/2019   Pneumococcal Conjugate-13 08/03/2015   Pneumococcal Polysaccharide-23 06/21/2009   Tdap 01/22/2014   Zoster Recombinat (Shingrix) 12/28/2016, 02/28/2017   Pertinent  Health Maintenance Due  Topic Date Due   HEMOGLOBIN A1C  Never done   FOOT EXAM  Never done   OPHTHALMOLOGY EXAM  Never done   INFLUENZA VACCINE  01/30/2021   DEXA SCAN  Completed   PNA vac Low Risk Adult  Completed   No flowsheet data found.   Vitals:   01/30/21 1025  BP: 121/77  Pulse: 88  Resp: 18  Temp: (!) 97 F (36.1 C)  Height: '5\' 1"'$  (1.549 m)   Body mass index is 25.34 kg/m.  Physical Exam  GENERAL APPEARANCE: Well nourished. In no acute distress. Normal body habitus SKIN:  Skin is warm and dry. There are no suspicious lesions or rash HEAD:  Normal in size and contour. No evidence of trauma EYES: Lids open and close normally. No blepharitis, entropion or ectropion. PERRL. Conjunctivae are clear and sclerae are white. Lenses are without opacity EARS: Pinnae are normal. Patient hears normal voice tunes of the examiner MOUTH and THROAT: Lips are without lesions. Oral mucosa is moist and without lesions. Tongue is normal in shape, size, and color and without lesions NECK: supple, trachea midline, no neck masses, no thyroid tenderness, no thyromegaly LYMPHATICS: No LAN in the neck, no supraclavicular LAN RESPIRATORY: Breathing is even & unlabored, BS CTAB CARDIAC: RRR, no murmur,no extra heart sounds, no edema GI: Abdomen soft, normal BS, no masses, no tenderness, no hepatomegaly, no splenomegaly MUSCULOSKELETAL: No deformities. Movement at each extremity is full and painless. Strength is 5/5 at each extremity. Back is without kyphosis or scoliosis CIRCULATION: Pedal pulses are 2+. There is no edema of the legs, ankles and feet NEUROLOGICAL: There is no tremor. Speech is clear PSYCHIATRIC: Alert and oriented X 3. Affect and behavior are appropriate  Labs reviewed: Recent Labs    02/05/20 0936  NA 139  K 3.9  CL 104  CO2 27  GLUCOSE 104*  BUN 22  CREATININE 1.23*  CALCIUM 9.6   No results for input(s): AST, ALT, ALKPHOS, BILITOT, PROT, ALBUMIN in the last 8760 hours. Recent Labs    02/05/20 0936  WBC 5.2  HGB 11.7*  HCT 36.5  MCV 91.0  PLT 191   No results found for: TSH No results found for: HGBA1C No results found for: CHOL, HDL, LDLCALC, LDLDIRECT, TRIG, CHOLHDL  Significant Diagnostic Results in last 30 days:  No results found.  Assessment/Plan    Family/ staff Communication:   Labs/tests ordered:    Goals of care:      Durenda Age, DNP, MSN, FNP-BC [facility] [phone] (Monday-Friday 8:00 a.m. - 5:00 p.m.) [phone] (after hours)

## 2021-02-02 ENCOUNTER — Encounter: Payer: Self-pay | Admitting: Adult Health

## 2021-02-02 ENCOUNTER — Non-Acute Institutional Stay (SKILLED_NURSING_FACILITY): Payer: Medicare Other | Admitting: Adult Health

## 2021-02-02 DIAGNOSIS — E782 Mixed hyperlipidemia: Secondary | ICD-10-CM

## 2021-02-02 DIAGNOSIS — U071 COVID-19: Secondary | ICD-10-CM

## 2021-02-02 DIAGNOSIS — F319 Bipolar disorder, unspecified: Secondary | ICD-10-CM

## 2021-02-02 DIAGNOSIS — N390 Urinary tract infection, site not specified: Secondary | ICD-10-CM

## 2021-02-02 DIAGNOSIS — I10 Essential (primary) hypertension: Secondary | ICD-10-CM

## 2021-02-02 DIAGNOSIS — N3281 Overactive bladder: Secondary | ICD-10-CM

## 2021-02-02 DIAGNOSIS — R531 Weakness: Secondary | ICD-10-CM

## 2021-02-02 DIAGNOSIS — N1832 Chronic kidney disease, stage 3b: Secondary | ICD-10-CM

## 2021-02-02 DIAGNOSIS — E1122 Type 2 diabetes mellitus with diabetic chronic kidney disease: Secondary | ICD-10-CM | POA: Diagnosis not present

## 2021-02-02 MED ORDER — DIVALPROEX SODIUM ER 500 MG PO TB24: 500.0000 mg | ORAL_TABLET | Freq: Every day | ORAL | 0 refills | Status: AC

## 2021-02-02 MED ORDER — PRAVASTATIN SODIUM 40 MG PO TABS: 40.0000 mg | ORAL_TABLET | Freq: Every day | ORAL | 0 refills | Status: AC

## 2021-02-02 MED ORDER — GABAPENTIN 300 MG PO CAPS: 600.0000 mg | ORAL_CAPSULE | Freq: Every day | ORAL | 0 refills | Status: AC

## 2021-02-02 MED ORDER — LOSARTAN POTASSIUM 25 MG PO TABS: 25.0000 mg | ORAL_TABLET | Freq: Every day | ORAL | 0 refills | Status: AC

## 2021-02-02 MED ORDER — MIRABEGRON ER 25 MG PO TB24: 25.0000 mg | ORAL_TABLET | Freq: Every day | ORAL | 0 refills | Status: AC

## 2021-02-02 MED ORDER — DICLOFENAC SODIUM 1 % EX GEL: 2.0000 g | Freq: Four times a day (QID) | CUTANEOUS | 0 refills | Status: DC | PRN

## 2021-02-02 MED ORDER — QUETIAPINE FUMARATE 50 MG PO TABS: 50.0000 mg | ORAL_TABLET | Freq: Every day | ORAL | 0 refills | Status: AC

## 2021-02-02 NOTE — Progress Notes (Signed)
Location:  [place] Room Number: [place] of Service:  SNF (31) Provider:  Durenda Age, DNP, FNP-BC  Patient Care Team: [name], MD as PCP - General [facility] Medicine)  Extended Emergency Contact Information Primary Emergency Contact: [facility] Address: [address]          [place], [place] [address] Tracey Wilson of [place] Phone: [phone] Relation: Son Secondary Emergency Contact: Packett,Kenneth Address: [place], [place] [address] Tracey Wilson of [place] Mobile Phone: [phone] Relation: Son  Code Status:  DNR  Goals of care: Advanced Directive information Advanced Directives 02/02/2021  Does Patient Have a Medical Advance Directive? Yes  Type of Paramedic of [place];Living will  Does patient want to make changes to medical advance directive? No - Patient declined  Copy of [place] in Chart? Yes - validated most recent copy scanned in chart (See row information)     Chief Complaint  Patient presents with   Discharge Note    For discharge home on 02/03/21    HPI:  Tracey Wilson is a 75 y.o. female who is for discharge home on 02/03/21 with Home health Tracey Wilson and OT.  She was admitted to [place] on 01/20/21 post [facility] admission  01/17/21 to 01/20/21.  She has a PMH of CKD 3, hypertension and type 2 diabetes mellitus.  She presented to [facility] with generalized weakness and was found to have acute COVID-19 infection and UTI.  She presented with multiple days of recurrent falls and generalized weakness at home.  She had received a single J&J vaccine on 04/2020.  She was given supplemental oxygen 1 to 2 L/minute throughout hospitalization.  Her UTI was treated with 3 days of nitrofurantoin then changed to cephalexin x5 days.  Patient was admitted to this facility for short-term rehabilitation after the patient's recent hospitalization.  Patient has  completed SNF rehabilitation and therapy has cleared the patient for discharge.   Past Medical History:  Diagnosis Date   Arthritis    osteo, multiple sites   Bipolar 1 disorder, mixed ([place])    Cancer ([place])    skin-malignant neoplasm,scc   Chronic kidney disease    Cyst of ovary    Dysrhythmia    left ventricular systolic dysfunction   HOH (hard of hearing)    right ear   Hyperlipidemia    Hypertension    Lordosis of lumbar region    kyphosis   Peripheral neuropathy    balls of feet   RBBB (right bundle branch block)    Dr Gareth Eagle [phone]   UTI (urinary tract infection)    June at [facility] ER   Vertigo    dental office chairs   Past Surgical History:  Procedure Laterality Date   BREAST BIOPSY Right years ago   2 bx. done. Negative results, benign lumpectomy   COLONOSCOPY  2010,2013   diverticulosis   COLONOSCOPY WITH PROPOFOL N/A 09/26/2017   Procedure: COLONOSCOPY WITH PROPOFOL;  Surgeon: Lollie Sails, MD;  Location: [facility] ENDOSCOPY;  Service: Endoscopy;  Laterality: N/A;   EYE SURGERY Bilateral [phone]   cataract, right then left   FOOT SURGERY Right 2014   bunionectomy with hammertoe   FOREIGN BODY REMOVAL Left 12/04/2018   Procedure: F/B REMOVAL, COMPLICATED;  Surgeon: Albertine Patricia, DPM;  Location: [place];  Service: Podiatry;  Laterality: Left;   removed hammerlock implant and  screw x 1   HAMMER TOE SURGERY Left 04/27/2015   Procedure: HAMMER TOE CORRECTION 2ND TOE;  Surgeon: [name], DPM;  Location: [place];  Service: Podiatry;  Laterality: Left;   LAPAROSCOPIC BILATERAL SALPINGO OOPHERECTOMY Bilateral 02/08/2020   Procedure: LAPAROSCOPIC BILATERAL SALPINGO OOPHORECTOMY, ABDOMINAL AHESIONS LYSIS;  Surgeon: Schermerhorn, Gwen Her, MD;  Location: [facility] ORS;  Service: Gynecology;  Laterality: Bilateral;   METATARSAL OSTEOTOMY Left 04/27/2015   Procedure: METATARSAL OSTEOTOMY 1ST METATARSAL AND Proximal phalanx osteotomy.;   Surgeon: [name], DPM;  Location: [place];  Service: Podiatry;  Laterality: Left;   MOHS SURGERY  07/10/2018   face and right forearm   RECONSTRUCTION OF ANGULAR DEFORMITY,TOE Left 12/04/2018   Procedure: RECONSTRUCTION OVERLAPPING TOE LEFT;  Surgeon: Albertine Patricia, DPM;  Location: [place];  Service: Podiatry;  Laterality: Left;   TUBAL LIGATION      Allergies  Allergen Reactions   Latex Rash   Mangifera Indica Anaphylaxis   Other Swelling    Mango skin - swelling of lips   Shellfish-Derived Products Hives    shrimp   Adhesive [Tape] Rash    silicone    Outpatient Encounter Medications as of 02/02/2021  Medication Sig   acetaminophen (TYLENOL) 325 MG tablet Take 650 mg by mouth every 6 (six) hours as needed for moderate pain.   Artificial Saliva (BIOTENE MOISTURIZING MOUTH MT) Use as directed 10 mLs in the mouth or throat 2 (two) times daily as needed (dry mouth).    aspirin 81 MG tablet Take 81 mg by mouth at bedtime.   Coenzyme Q10 (CO Q10) 100 MG CAPS Take 200 mg by mouth daily.   diclofenac Sodium (VOLTAREN) 1 % GEL Apply 2 g topically 4 (four) times daily as needed (pain).    divalproex (DEPAKOTE ER) 500 MG 24 hr tablet Take 500 mg by mouth at bedtime.    Ensure (ENSURE) Take 237 mLs by mouth.   gabapentin (NEURONTIN) 300 MG capsule Take 600 mg by mouth at bedtime.   Lactobacillus (LACTINEX PO) Take 1 packet by mouth daily.   losartan (COZAAR) 25 MG tablet Take 25 mg by mouth daily.   MAGNESIUM CITRATE PO Take 250 mg by mouth at bedtime.    Menthol 1 MG LOZG Use as directed 1 lozenge in the mouth or throat every 2 (two) hours as needed (for cough).   mirabegron ER (MYRBETRIQ) 25 MG TB24 tablet Take 25 mg by mouth daily.   polyethylene glycol (MIRALAX / GLYCOLAX) 17 g packet Take 17 g by mouth daily.   pravastatin (PRAVACHOL) 40 MG tablet Take 40 mg by mouth at bedtime.   QUEtiapine (SEROQUEL) 50 MG tablet Take 50 mg by mouth at bedtime.   sodium  fluoride (FLUORISHIELD) 1.1 % GEL dental gel Place 1 application onto teeth in the morning and at bedtime.    [DISCONTINUED] cephALEXin (KEFLEX) 500 MG capsule Take 500 mg by mouth every 6 (six) hours. X 4 days   No facility-administered encounter medications on file as of 02/02/2021.    Review of Systems  GENERAL: No change in appetite, no fatigue, no weight changes, no fever or chills  MOUTH and THROAT: Denies oral discomfort, gingival pain or bleeding RESPIRATORY: no cough, SOB, DOE, wheezing, hemoptysis CARDIAC: No chest pain, edema or palpitations GI: No abdominal pain, diarrhea, constipation, heart burn, nausea or vomiting GU: Denies dysuria, frequency, hematuria, incontinence, or discharge NEUROLOGICAL: Denies dizziness, syncope, numbness, or headache PSYCHIATRIC: Denies feelings of depression or anxiety.  No report of hallucinations, insomnia, paranoia, or agitation   Immunization History  Administered Date(s) Administered   Fluad Quad(high Dose 65+) 03/12/2019   H1N1 07/27/2008   Influenza Split 06/11/2008   Influenza, High Dose Seasonal PF 06/21/2009, 04/09/2017, 04/01/2018   Influenza-Unspecified 04/03/2012, 03/03/2014, 04/04/2015, 04/10/2016, 04/01/2018, 03/12/2019, 03/12/2020   Janssen (J&J) SARS-COV-2 Vaccination 10/10/2019   Pneumococcal Conjugate-13 08/03/2015   Pneumococcal Polysaccharide-23 06/21/2009   Tdap 01/22/2014   Zoster Recombinat (Shingrix) 12/28/2016, 02/28/2017   Pertinent  Health Maintenance Due  Topic Date Due   HEMOGLOBIN A1C  Never done   FOOT EXAM  Never done   OPHTHALMOLOGY EXAM  Never done   INFLUENZA VACCINE  01/30/2021   DEXA SCAN  Completed   PNA vac Low Risk Adult  Completed   No flowsheet data found.   Vitals:   02/02/21 1627  BP: 119/61  Pulse: 84  Resp: 19  Temp: 97.7 F (36.5 C)  Weight: 138 lb 9.6 oz (62.9 kg)  Height: '5\' 1"'$  (1.549 m)   Body mass index is 26.19 kg/m.  Physical Exam  GENERAL APPEARANCE: Well nourished.  In no acute distress. Normal body habitus SKIN:  Skin is warm and dry.  MOUTH and THROAT: Lips are without lesions. Oral mucosa is moist and without lesions.  RESPIRATORY: Breathing is even & unlabored, BS CTAB CARDIAC: RRR, no murmur,no extra heart sounds, no edema GI: Abdomen soft, normal BS, no masses, no tenderness NEUROLOGICAL: There is no tremor. Speech is clear. Alert and oriented X 3. PSYCHIATRIC:  Affect and behavior are appropriate  Labs reviewed: Recent Labs    02/05/20 0936  NA 139  K 3.9  CL 104  CO2 27  GLUCOSE 104*  BUN 22  CREATININE 1.23*  CALCIUM 9.6    Recent Labs    02/05/20 0936  WBC 5.2  HGB 11.7*  HCT 36.5  MCV 91.0  PLT 191    Assessment/Plan  1. Generalized weakness -  for Home health Tracey Wilson and OT, for therapeutic strengthening exercises -  fall precautions  2. COVID-19 virus infection -  chest x-ray normal -   O2 was discontinued and now on room air  3. Urinary tract infection without hematuria, site unspecified -  was treated with 3 days of nitrofurantoin then changed to cephalexin x5 days. -  completed antibiotics, resolved  4. Type 2 diabetes mellitus with stage 3b chronic kidney disease, without long-term current use of insulin (HCC) -  A1C 6.6, 01/27/21 -  diet-controlled  5. Primary hypertension - losartan (COZAAR) 25 MG tablet; Take 1 tablet (25 mg total) by mouth daily.  Dispense: 30 tablet; Refill: 0  6. Depressed bipolar I disorder (HCC) - QUEtiapine (SEROQUEL) 50 MG tablet; Take 1 tablet (50 mg total) by mouth at bedtime.  Dispense: 30 tablet; Refill: 0 - divalproex (DEPAKOTE ER) 500 MG 24 hr tablet; Take 1 tablet (500 mg total) by mouth at bedtime.  Dispense: 30 tablet; Refill: 0 - gabapentin (NEURONTIN) 300 MG capsule; Take 2 capsules (600 mg total) by mouth at bedtime.  Dispense: 60 capsule; Refill: 0  7. OAB (overactive bladder) - mirabegron ER (MYRBETRIQ) 25 MG TB24 tablet; Take 1 tablet (25 mg total) by mouth daily.   Dispense: 30 tablet; Refill: 0  8. Mixed hyperlipidemia - pravastatin (PRAVACHOL) 40 MG tablet; Take 1 tablet (40 mg total) by mouth at bedtime.  Dispense: 30 tablet; Refill: 0     I have filled out patient's discharge paperwork and e-prescribed medications.  Patient  will have home health Tracey Wilson and OT.  DME provided:  None  Total discharge time: Greater than 30 minutes Greater than 50% was spent in counseling and coordination of care.   Discharge time involved coordination of the discharge process with social worker, nursing staff and therapy department. Medical justification for home health services verified.   Durenda Age, DNP, MSN, FNP-BC [facility] [phone] (Monday-Friday 8:00 a.m. - 5:00 p.m.) [phone] (after hours)

## 2021-03-10 ENCOUNTER — Other Ambulatory Visit: Payer: Self-pay | Admitting: Adult Health

## 2021-03-10 DIAGNOSIS — F319 Bipolar disorder, unspecified: Secondary | ICD-10-CM

## 2021-03-17 ENCOUNTER — Encounter: Payer: Self-pay | Admitting: *Deleted

## 2021-03-20 ENCOUNTER — Encounter
Admission: RE | Disposition: A | Discharge status: Home / Self Care | Payer: Self-pay | Source: Home / Self Care | Attending: Gastroenterology

## 2021-03-20 ENCOUNTER — Ambulatory Visit
Admission: RE | Admit: 2021-03-20 | Discharge: 2021-03-20 | Disposition: A | Discharge status: Home / Self Care | Payer: Medicare Other | Attending: Gastroenterology | Admitting: Gastroenterology

## 2021-03-20 ENCOUNTER — Ambulatory Visit: Payer: Medicare Other | Admitting: Anesthesiology

## 2021-03-20 ENCOUNTER — Other Ambulatory Visit: Payer: Self-pay

## 2021-03-20 DIAGNOSIS — Z800 Family history of malignant neoplasm of digestive organs: Secondary | ICD-10-CM | POA: Diagnosis not present

## 2021-03-20 DIAGNOSIS — Z90722 Acquired absence of ovaries, bilateral: Secondary | ICD-10-CM | POA: Insufficient documentation

## 2021-03-20 DIAGNOSIS — Z8371 Family history of colonic polyps: Secondary | ICD-10-CM | POA: Insufficient documentation

## 2021-03-20 DIAGNOSIS — N189 Chronic kidney disease, unspecified: Secondary | ICD-10-CM | POA: Insufficient documentation

## 2021-03-20 DIAGNOSIS — F319 Bipolar disorder, unspecified: Secondary | ICD-10-CM | POA: Insufficient documentation

## 2021-03-20 DIAGNOSIS — Z79899 Other long term (current) drug therapy: Secondary | ICD-10-CM | POA: Insufficient documentation

## 2021-03-20 DIAGNOSIS — Z91018 Allergy to other foods: Secondary | ICD-10-CM | POA: Diagnosis not present

## 2021-03-20 DIAGNOSIS — Z85828 Personal history of other malignant neoplasm of skin: Secondary | ICD-10-CM | POA: Insufficient documentation

## 2021-03-20 DIAGNOSIS — Z91040 Latex allergy status: Secondary | ICD-10-CM | POA: Insufficient documentation

## 2021-03-20 DIAGNOSIS — Z91013 Allergy to seafood: Secondary | ICD-10-CM | POA: Insufficient documentation

## 2021-03-20 DIAGNOSIS — Z9079 Acquired absence of other genital organ(s): Secondary | ICD-10-CM | POA: Insufficient documentation

## 2021-03-20 DIAGNOSIS — D120 Benign neoplasm of cecum: Secondary | ICD-10-CM | POA: Insufficient documentation

## 2021-03-20 DIAGNOSIS — Z91048 Other nonmedicinal substance allergy status: Secondary | ICD-10-CM | POA: Diagnosis not present

## 2021-03-20 DIAGNOSIS — I129 Hypertensive chronic kidney disease with stage 1 through stage 4 chronic kidney disease, or unspecified chronic kidney disease: Secondary | ICD-10-CM | POA: Insufficient documentation

## 2021-03-20 DIAGNOSIS — Z09 Encounter for follow-up examination after completed treatment for conditions other than malignant neoplasm: Secondary | ICD-10-CM | POA: Diagnosis present

## 2021-03-20 DIAGNOSIS — Z7982 Long term (current) use of aspirin: Secondary | ICD-10-CM | POA: Insufficient documentation

## 2021-03-20 DIAGNOSIS — E1122 Type 2 diabetes mellitus with diabetic chronic kidney disease: Secondary | ICD-10-CM | POA: Insufficient documentation

## 2021-03-20 HISTORY — PX: COLONOSCOPY WITH PROPOFOL: SHX5780

## 2021-03-20 HISTORY — DX: Cortical age-related cataract, unspecified eye: H25.019

## 2021-03-20 HISTORY — DX: Endocarditis, valve unspecified: I38

## 2021-03-20 HISTORY — DX: Other specified urinary incontinence: N39.498

## 2021-03-20 HISTORY — DX: Tremor, unspecified: R25.1

## 2021-03-20 HISTORY — DX: Abnormal electrocardiogram (ECG) (EKG): R94.31

## 2021-03-20 SURGERY — COLONOSCOPY WITH PROPOFOL
Anesthesia: General

## 2021-03-20 MED ORDER — SODIUM CHLORIDE 0.9 % IV SOLN: INTRAVENOUS | Status: DC

## 2021-03-20 MED ORDER — PROPOFOL 10 MG/ML IV BOLUS
INTRAVENOUS | Status: DC | PRN
  Administered 2021-03-20: 20 mg via INTRAVENOUS
  Administered 2021-03-20: 70 mg via INTRAVENOUS

## 2021-03-20 MED ORDER — PROPOFOL 500 MG/50ML IV EMUL
INTRAVENOUS | Status: AC
  Filled 2021-03-20: qty 50

## 2021-03-20 MED ORDER — LIDOCAINE HCL (PF) 2 % IJ SOLN
INTRAMUSCULAR | Status: AC
  Filled 2021-03-20: qty 5

## 2021-03-20 MED ORDER — LIDOCAINE HCL (CARDIAC) PF 100 MG/5ML IV SOSY
PREFILLED_SYRINGE | INTRAVENOUS | Status: DC | PRN
  Administered 2021-03-20: 50 mg via INTRAVENOUS

## 2021-03-20 MED ORDER — PROPOFOL 500 MG/50ML IV EMUL
INTRAVENOUS | Status: DC | PRN
  Administered 2021-03-20: 150 ug/kg/min via INTRAVENOUS

## 2021-03-20 NOTE — Transfer of Care (Signed)
Immediate Anesthesia Transfer of Care Note  Patient: NIXON SPARR  Procedure(s) Performed: COLONOSCOPY WITH PROPOFOL  Patient Location: PACU  Anesthesia Type:General  Level of Consciousness: drowsy  Airway & Oxygen Therapy: Patient Spontanous Breathing  Post-op Assessment: Report given to RN and Post -op Vital signs reviewed and stable  Post vital signs: Reviewed and stable  Last Vitals:  Vitals Value Taken Time  BP 113/66 03/20/21 0937  Temp 35.9 C 03/20/21 0936  Pulse 70 03/20/21 0937  Resp 19 03/20/21 0937  SpO2 100 % 03/20/21 0937  Vitals shown include unvalidated device data.  Last Pain:  Vitals:   03/20/21 0936  TempSrc: Tympanic  PainSc: Asleep         Complications: No notable events documented.

## 2021-03-20 NOTE — Anesthesia Preprocedure Evaluation (Signed)
Anesthesia Evaluation  Patient identified by MRN, date of birth, ID band Patient awake    Reviewed: Allergy & Precautions, H&P , NPO status , Patient's Chart, lab work & pertinent test results, reviewed documented beta blocker date and time   Airway Mallampati: II   Neck ROM: full    Dental  (+) Poor Dentition, Teeth Intact   Pulmonary neg pulmonary ROS,    Pulmonary exam normal        Cardiovascular Exercise Tolerance: Poor hypertension, On Medications Normal cardiovascular exam+ dysrhythmias  Rhythm:regular Rate:Normal     Neuro/Psych PSYCHIATRIC DISORDERS Bipolar Disorder  Neuromuscular disease    GI/Hepatic negative GI ROS, Neg liver ROS,   Endo/Other  diabetes  Renal/GU Renal disease  negative genitourinary   Musculoskeletal   Abdominal   Peds  Hematology negative hematology ROS (+)   Anesthesia Other Findings Past Medical History: No date: Abnormal EKG No date: Arthritis     Comment:  osteo, multiple sites No date: Bipolar 1 disorder, mixed (HCC) No date: Cancer ([place])     Comment:  skin-malignant neoplasm,scc No date: Cataract cortical, senile No date: Chronic kidney disease No date: Cyst of ovary 2010: Diverticulosis No date: Dysrhythmia     Comment:  left ventricular systolic dysfunction No date: HOH (hard of hearing)     Comment:  right ear No date: Hyperlipidemia No date: Hypertension No date: Lordosis of lumbar region     Comment:  kyphosis No date: Other urinary incontinence No date: Peripheral neuropathy     Comment:  balls of feet No date: RBBB (right bundle branch block)     Comment:  Dr Gareth Eagle [phone] No date: Tremor No date: UTI (urinary tract infection)     Comment:  June at [facility] ER No date: Valvular heart disease No date: Vertigo     Comment:  dental office chairs Past Surgical History: years ago: BREAST BIOPSY; Right     Comment:  2 bx. done. Negative results,  benign lumpectomy No date: CATARACT EXTRACTION; Bilateral 2010,2013,2019: COLONOSCOPY     Comment:  diverticulosis 09/26/2017: COLONOSCOPY WITH PROPOFOL; N/A     Comment:  Procedure: COLONOSCOPY WITH PROPOFOL;  Surgeon:               Lollie Sails, MD;  Location: [facility] ENDOSCOPY;                Service: Endoscopy;  Laterality: N/A; No date: DIAGNOSTIC LAPAROSCOPY 2008,2013: EYE SURGERY; Bilateral     Comment:  cataract, right then left 2014: FOOT SURGERY; Right     Comment:  bunionectomy with hammertoe 12/04/2018: FOREIGN BODY REMOVAL; Left     Comment:  Procedure: F/B REMOVAL, COMPLICATED;  Surgeon: Albertine Patricia, DPM;  Location: [place];  Service:               Podiatry;  Laterality: Left;   removed hammerlock implant              and screw x 1 04/27/2015: HAMMER TOE SURGERY; Left     Comment:  Procedure: HAMMER TOE CORRECTION 2ND TOE;  Surgeon:               [name], DPM;  Location: [place];                Service: Podiatry;  Laterality: Left; 02/08/2020: LAPAROSCOPIC BILATERAL SALPINGO OOPHERECTOMY; Bilateral     Comment:  Procedure: LAPAROSCOPIC BILATERAL SALPINGO OOPHORECTOMY,              ABDOMINAL AHESIONS LYSIS;  Surgeon: Schermerhorn, Gwen Her, MD;  Location: [facility] ORS;  Service: Gynecology;                Laterality: Bilateral; 04/27/2015: METATARSAL OSTEOTOMY; Left     Comment:  Procedure: METATARSAL OSTEOTOMY 1ST METATARSAL AND               Proximal phalanx osteotomy.;  Surgeon: [name],               DPM;  Location: [place];  Service: Podiatry;               Laterality: Left; 07/10/2018: MOHS SURGERY     Comment:  face and right forearm 12/04/2018: RECONSTRUCTION OF ANGULAR DEFORMITY,TOE; Left     Comment:  Procedure: RECONSTRUCTION OVERLAPPING TOE LEFT;                Surgeon: Albertine Patricia, DPM;  Location: [place];  Service: Podiatry;  Laterality:  Left; No date: TUBAL LIGATION BMI    Body Mass Index: 24.17 kg/m     Reproductive/Obstetrics negative OB ROS                             Anesthesia Physical Anesthesia Plan  ASA: 3  Anesthesia Plan: General   Post-op Pain Management:    Induction:   PONV Risk Score and Plan:   Airway Management Planned:   Additional Equipment:   Intra-op Plan:   Post-operative Plan:   Informed Consent: I have reviewed the patients History and Physical, chart, labs and discussed the procedure including the risks, benefits and alternatives for the proposed anesthesia with the patient or authorized representative who has indicated his/her understanding and acceptance.     Dental Advisory Given  Plan Discussed with: CRNA  Anesthesia Plan Comments:         Anesthesia Quick Evaluation

## 2021-03-20 NOTE — H&P (Signed)
Outpatient short stay form Pre-procedure 03/20/2021  [name], MD  Primary Physician: [name], MD  Reason for visit:  Surveillance  History of present illness:   75 y/o lady with history of bipolar disorder and hypertension here for surveillance colonoscopy. Last colonoscopy was 3 years ago with > 3 Ta's with one being 11 mm in size. History of oopherectomy. First cousin with colon cancer but no first degree relatives.    Current Facility-Administered Medications:    0.9 %  sodium chloride infusion, , Intravenous, Continuous, Leanda Padmore, Hilton Cork, MD  Medications Prior to Admission  Medication Sig Dispense Refill Last Dose   acetaminophen (TYLENOL) 325 MG tablet Take 650 mg by mouth every 6 (six) hours as needed for moderate pain.   Past Week   Artificial Saliva (BIOTENE MOISTURIZING MOUTH MT) Use as directed 10 mLs in the mouth or throat 2 (two) times daily as needed (dry mouth).    Past Week   aspirin 81 MG tablet Take 81 mg by mouth at bedtime.   Past Week   BIOTIN PO Take by mouth daily.   Past Week   Coenzyme Q10 (CO Q10) 100 MG CAPS Take 200 mg by mouth daily.   Past Week   diclofenac Sodium (VOLTAREN) 1 % GEL Apply 2 g topically 4 (four) times daily as needed (pain). 2 g 0 Past Week   divalproex (DEPAKOTE ER) 500 MG 24 hr tablet Take 1 tablet (500 mg total) by mouth at bedtime. 30 tablet 0 03/19/2021 at 2200   Ensure (ENSURE) Take 237 mLs by mouth.   Past Week   Flaxseed, Linseed, (FLAXSEED OIL PO) Take by mouth daily.   Past Week   gabapentin (NEURONTIN) 300 MG capsule Take 2 capsules (600 mg total) by mouth at bedtime. 60 capsule 0 1/88/4166 at 0630   GARLIC PO Take by mouth daily.   03/19/2021 at 0800   Lactobacillus (LACTINEX PO) Take 1 packet by mouth daily.   03/19/2021 at 0800   losartan (COZAAR) 25 MG tablet Take 1 tablet (25 mg total) by mouth daily. 30 tablet 0 03/19/2021 at 2200   MAGNESIUM CITRATE PO Take 250 mg by mouth at bedtime.    03/19/2021 at  2200   mirabegron ER (MYRBETRIQ) 25 MG TB24 tablet Take 1 tablet (25 mg total) by mouth daily. 30 tablet 0 Past Week   polyethylene glycol (MIRALAX / GLYCOLAX) 17 g packet Take 17 g by mouth daily.   Past Week   pravastatin (PRAVACHOL) 40 MG tablet Take 1 tablet (40 mg total) by mouth at bedtime. 30 tablet 0 03/19/2021 at 2200   QUEtiapine (SEROQUEL) 50 MG tablet Take 1 tablet (50 mg total) by mouth at bedtime. 30 tablet 0 03/19/2021 at 2200   Sod Fluoride-Potassium Nitrate (PREVIDENT 5000 SENSITIVE) 1.1-5 % GEL Place onto teeth at bedtime.   03/20/2021 at 800   sodium fluoride (FLUORISHIELD) 1.1 % GEL dental gel Place 1 application onto teeth in the morning and at bedtime.    Past Week at 800   TURMERIC-GINGER PO Take by mouth daily.   03/19/2021 at 0800   Menthol 1 MG LOZG Use as directed 1 lozenge in the mouth or throat every 2 (two) hours as needed (for cough).        Allergies  Allergen Reactions   Latex Rash   Mangifera Indica Anaphylaxis   Other Swelling    Mango skin - swelling of lips   Shellfish-Derived Products Hives  shrimp   Adhesive [Tape] Rash    silicone     Past Medical History:  Diagnosis Date   Abnormal EKG    Arthritis    osteo, multiple sites   Bipolar 1 disorder, mixed (HCC)    Cancer (HCC)    skin-malignant neoplasm,scc   Cataract cortical, senile    Chronic kidney disease    Cyst of ovary    Diverticulosis 2010   Dysrhythmia    left ventricular systolic dysfunction   HOH (hard of hearing)    right ear   Hyperlipidemia    Hypertension    Lordosis of lumbar region    kyphosis   Other urinary incontinence    Peripheral neuropathy    balls of feet   RBBB (right bundle branch block)    Dr Gareth Eagle [phone]   Tremor    UTI (urinary tract infection)    June at [facility] ER   Valvular heart disease    Vertigo    dental office chairs    Review of systems:  Otherwise negative.    Physical Exam  Gen: Alert, oriented. Appears stated age.   HEENT: PERRLA. Lungs: No respiratory distress CV: RRR Abd: soft, benign, no masses Ext: No edema    Planned procedures: Proceed with colonoscopy. The patient understands the nature of the planned procedure, indications, risks, alternatives and potential complications including but not limited to bleeding, infection, perforation, damage to internal organs and possible oversedation/side effects from anesthesia. The patient agrees and gives consent to proceed.  Please refer to procedure notes for findings, recommendations and patient disposition/instructions.     [name], MD [facility]astroenterology

## 2021-03-20 NOTE — Anesthesia Procedure Notes (Signed)
Date/Time: 03/20/2021 9:00 AM Performed by: Johnna Acosta, CRNA Pre-anesthesia Checklist: Patient identified, Emergency Drugs available, Suction available, Patient being monitored and Timeout performed Patient Re-evaluated:Patient Re-evaluated prior to induction Oxygen Delivery Method: Nasal cannula Preoxygenation: Pre-oxygenation with 100% oxygen Induction Type: IV induction

## 2021-03-20 NOTE — Anesthesia Postprocedure Evaluation (Signed)
Anesthesia Post Note  Patient: Tracey Wilson  Procedure(s) Performed: COLONOSCOPY WITH PROPOFOL  Patient location during evaluation: PACU Anesthesia Type: General Level of consciousness: awake and alert Pain management: pain level controlled Vital Signs Assessment: post-procedure vital signs reviewed and stable Respiratory status: spontaneous breathing, nonlabored ventilation, respiratory function stable and patient connected to nasal cannula oxygen Cardiovascular status: blood pressure returned to baseline and stable Postop Assessment: no apparent nausea or vomiting Anesthetic complications: no   No notable events documented.   Last Vitals:  Vitals:   03/20/21 0936 03/20/21 0937  BP: 113/66 113/66  Pulse: 67 69  Resp: (!) 21 20  Temp: (!) 35.9 C   SpO2: 100% 100%    Last Pain:  Vitals:   03/20/21 0936  TempSrc: Tympanic  PainSc: [name]

## 2021-03-20 NOTE — Interval H&P Note (Signed)
History and Physical Interval Note:  03/20/2021 8:51 AM  Tracey Wilson  has presented today for surgery, with the diagnosis of family history of polyps of colon.  The various methods of treatment have been discussed with the patient and family. After consideration of risks, benefits and other options for treatment, the patient has consented to  Procedure(s): COLONOSCOPY WITH PROPOFOL (N/A) as a surgical intervention.  The patient's history has been reviewed, patient examined, no change in status, stable for surgery.  I have reviewed the patient's chart and labs.  Questions were answered to the patient's satisfaction.     Tracey Wilson  Ok to proceed with colonoscopy

## 2021-03-20 NOTE — Op Note (Addendum)
[facility] Patient Name: Tracey Wilson Procedure Date: 03/20/2021 8:43 AM MRN: [id number] Account #: [id number] Date of Birth: 07-Dec-1945 Admit Type: Outpatient Age: 75 Room: [facility] ENDO ROOM 3 Gender: Female Note Status: Supervisor Override Instrument Name: Park Meo [id number] Procedure:             Colonoscopy Indications:           Family history of colon polyps Providers:             Andrey Farmer MD, MD Referring MD:          [name] (Referring MD) Medicines:             Monitored Anesthesia Care Complications:         No immediate complications. Estimated blood loss:                         Minimal. Procedure:             Pre-Anesthesia Assessment:                        - Prior to the procedure, a History and Physical was                         performed, and patient medications and allergies were                         reviewed. The patient is competent. The risks and                         benefits of the procedure and the sedation options and                         risks were discussed with the patient. All questions                         were answered and informed consent was obtained.                         Patient identification and proposed procedure were                         verified by the physician, the nurse, the anesthetist                         and the technician in the endoscopy suite. Mental                         Status Examination: alert and oriented. Airway                         Examination: normal oropharyngeal airway and neck                         mobility. Respiratory Examination: clear to                         auscultation. CV Examination: normal. Prophylactic  Antibiotics: The patient does not require prophylactic                         antibiotics. Prior Anticoagulants: The patient has                         taken no previous anticoagulant or antiplatelet agents                          except for aspirin. ASA Grade Assessment: II - A                         patient with mild systemic disease. After reviewing                         the risks and benefits, the patient was deemed in                         satisfactory condition to undergo the procedure. The                         anesthesia plan was to use monitored anesthesia care                         (MAC). Immediately prior to administration of                         medications, the patient was re-assessed for adequacy                         to receive sedatives. The heart rate, respiratory                         rate, oxygen saturations, blood pressure, adequacy of                         pulmonary ventilation, and response to care were                         monitored throughout the procedure. The physical                         status of the patient was re-assessed after the                         procedure.                        After obtaining informed consent, the colonoscope was                         passed under direct vision. Throughout the procedure,                         the patient's blood pressure, pulse, and oxygen                         saturations were monitored continuously. The  Colonoscope was introduced through the anus and                         advanced to the the cecum, identified by appendiceal                         orifice and ileocecal valve. The colonoscopy was                         technically difficult and complex due to a redundant                         colon. The patient tolerated the procedure well. The                         quality of the bowel preparation was good. Findings:      The perianal and digital rectal examinations were normal.      A 7 mm polyp was found in the cecum. The polyp was sessile. The polyp       was removed with a cold snare. Resection and retrieval were complete.       Estimated blood loss was  minimal.      The exam was otherwise without abnormality on direct and retroflexion       views. Impression:            - One 7 mm polyp in the cecum, removed with a cold                         snare. Resected and retrieved.                        - The examination was otherwise normal on direct and                         retroflexion views. Recommendation:        - Discharge patient to home.                        - Resume previous diet.                        - Continue present medications.                        - Await pathology results.                        - Repeat colonoscopy is not recommended due to current                         age (16 years or older) for surveillance.                        - Return to referring physician as previously                         scheduled. Procedure Code(s):     --- Professional ---  45385, Colonoscopy, flexible; with removal of                         tumor(s), polyp(s), or other lesion(s) by snare                         technique Diagnosis Code(s):     --- Professional ---                        Z86.010, Personal history of colonic polyps                        K63.5, Polyp of colon CPT copyright 2019 American [employer] Association. All rights reserved. The codes documented in this report are preliminary and upon coder review may  be revised to meet current compliance requirements. Andrey Farmer MD, MD 03/20/2021 9:37:05 AM Number of Addenda: 0 Note Initiated On: 03/20/2021 8:43 AM Scope Withdrawal Time: 0 hours 14 minutes 38 seconds  Total Procedure Duration: 0 hours 24 minutes 48 seconds  Estimated Blood Loss:  Estimated blood loss was minimal.      [facility]

## 2021-03-21 ENCOUNTER — Encounter: Payer: Self-pay | Admitting: Gastroenterology

## 2021-03-21 LAB — SURGICAL PATHOLOGY

## 2021-11-13 ENCOUNTER — Other Ambulatory Visit: Payer: Self-pay | Admitting: Family Medicine

## 2021-11-13 DIAGNOSIS — Z1231 Encounter for screening mammogram for malignant neoplasm of breast: Secondary | ICD-10-CM

## 2021-12-14 ENCOUNTER — Ambulatory Visit
Admission: RE | Admit: 2021-12-14 | Discharge: 2021-12-14 | Disposition: A | Discharge status: Home / Self Care | Payer: Medicare Other | Source: Ambulatory Visit | Attending: Family Medicine | Admitting: Family Medicine

## 2021-12-14 DIAGNOSIS — Z1231 Encounter for screening mammogram for malignant neoplasm of breast: Secondary | ICD-10-CM | POA: Insufficient documentation

## 2022-11-30 ENCOUNTER — Encounter: Payer: Self-pay | Admitting: Skilled Nursing Facility1

## 2022-11-30 ENCOUNTER — Encounter: Payer: 59 | Attending: Skilled Nursing Facility1 | Admitting: Skilled Nursing Facility1

## 2022-11-30 DIAGNOSIS — E119 Type 2 diabetes mellitus without complications: Secondary | ICD-10-CM | POA: Diagnosis present

## 2022-11-30 DIAGNOSIS — N183 Chronic kidney disease, stage 3 unspecified: Secondary | ICD-10-CM | POA: Insufficient documentation

## 2022-11-30 NOTE — Progress Notes (Signed)
  Medical Nutrition Therapy    Primary concerns today: overall health and safety   Referral diagnosis: CKD stage 3, DM   NUTRITION ASSESSMENT    Clinical Medical Hx: DM, CKD stage 3 Medications: see list Labs: A1C 6.3, creatinine 1.4 Notable Signs/Symptoms: none reported   Lifestyle & Dietary Hx  Allergic to shellfish  Pt states she weighs 130.2 pounds. Pt states she sleeps through the night sleeping about 12 hours. Pt states she as tingling in the ball of her feet for the last few months.  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   Other Dx: Arthritis Cataracts Cancer Hyerlpidemia HTN CKD stage 3 Diverticulosis   Goals: Limit to 4 ounces of juice Do not skip meals If your A1C continues to increase you can switch to glucerna but only if it increases  Ensure you have at least 60 fluid ounces per day for kidney health   Estimated daily fluid intake: 60+ oz Supplements: collegan  Sleep: 12 hours through the night Stress / self-care: low stress level Current average weekly physical activity: ADL's  24 hr recall: Breakfast 12: collagen in juice and 1 packet oatmeal + dried cranberries + almond milk + truvia Snack: Lunch: equate chocolate  Snack: Dinner: canned soup + peanut butter on crackers or frozen dinner Snack: 2 squares 90% chocolate  Beverages: tea and honey, 60 ounces water, propel water    NUTRITION INTERVENTION  Nutrition education (E-1) on the following topics:  Hypoglycemia Education:   Recognition: Trained pt to recognize the signs and symptoms of hypoglycemia, which can include shakiness, sweating, dizziness, confusion, irritability, weakness, hunger, and rapid heartbeat.  Monitoring: Regular blood glucose monitoring to detect low blood sugar levels early. This may involve using a glucose meter or continuous glucose monitoring system. Treatment Thresholds: Establishing specific blood glucose thresholds at  which action should be taken to treat hypoglycemia. These thresholds may vary depending on factors such as age, individual health status, and type of diabetes. Below 70 is considered low and warrants a need to correct.  Treatment Options: Provided guidance on appropriate treatment options for hypoglycemia. Common treatments include consuming fast-acting carbohydrates such as glucose tablets, fruit juice, or glucose gel. In some cases, glucagon injection kits may be used for severe hypoglycemia when the individual is unable to consume carbohydrates orally; resting for 15 minutes and then rechecking, once blood sugar is back to the normal range eating a snack or meal with protein Follow-Up: Ensuring appropriate follow-up care after a hypoglycemic episode, including monitoring for recurrent episodes  Education: Offering education and support to individuals with hypoglycemia and their caregivers on how to prevent and manage hypoglycemia, including the importance of balanced meals, regular exercise, medication adherence, and the role of blood glucose monitoring. Emergency Procedures: Providing clear instructions on when to seek emergency medical assistance for severe hypoglycemia that does not respond to initial treatment or for individuals who are unconscious or unable to swallow. Prevention is important as hypoglycemia can be harmful with such examples as loss of consciousness and cardiovascular injury.    Learning Style & Readiness for Change Teaching method utilized: Visual & Auditory  Demonstrated degree of understanding via: Teach Back  Barriers to learning/adherence to lifestyle change: none identified

## 2023-03-11 ENCOUNTER — Ambulatory Visit (INDEPENDENT_AMBULATORY_CARE_PROVIDER_SITE_OTHER): Payer: 59 | Admitting: Urology

## 2023-03-11 ENCOUNTER — Encounter: Payer: Self-pay | Admitting: Urology

## 2023-03-11 VITALS — BP 124/80 | HR 74 | Ht 61.0 in | Wt 130.0 lb

## 2023-03-11 DIAGNOSIS — N39498 Other specified urinary incontinence: Secondary | ICD-10-CM

## 2023-03-11 DIAGNOSIS — N3946 Mixed incontinence: Secondary | ICD-10-CM

## 2023-03-11 DIAGNOSIS — N393 Stress incontinence (female) (male): Secondary | ICD-10-CM

## 2023-03-11 LAB — URINALYSIS, COMPLETE
Bilirubin, UA: NEGATIVE
Glucose, UA: NEGATIVE
Ketones, UA: NEGATIVE
Leukocytes,UA: NEGATIVE
Nitrite, UA: NEGATIVE
Protein,UA: NEGATIVE
RBC, UA: NEGATIVE
Specific Gravity, UA: 1.01 (ref 1.005–1.030)
Urobilinogen, Ur: 0.2 mg/dL (ref 0.2–1.0)
pH, UA: 7 (ref 5.0–7.5)

## 2023-03-11 LAB — MICROSCOPIC EXAMINATION

## 2023-03-11 MED ORDER — GEMTESA 75 MG PO TABS: 75.0000 mg | ORAL_TABLET | Freq: Every day | ORAL | 11 refills | Status: DC

## 2023-03-11 NOTE — Progress Notes (Signed)
03/11/2023 2:02 PM   Tracey Wilson 20-Apr-1946 [id number]  Referring provider: Marina Goodell, MD [address] [place],  [place] [address]  Chief Complaint  Patient presents with   Urinary Incontinence    HPI: Was consulted to assess the patient's incontinence.  She has urge incontinence.  She leaks with coughing sneezing but not bending lifting.  I do not think she has bedwetting but she has foot on the floor syndrome.  When she takes Myrbetriq 25 mg she sleeps for 12 hours and voids every 1-2 hours during the day.  She wears 4 pads a day that are damp.  Borderline diabetic.  Okay to get a bladder infection.  No hysterectomy  No history of kidney stones or bladder surgery.   PMH: Past Medical History:  Diagnosis Date   Abnormal EKG    Arthritis    osteo, multiple sites   Bipolar 1 disorder, mixed (HCC)    Cancer (HCC)    skin-malignant neoplasm,scc   Cataract cortical, senile    Chronic kidney disease    Cyst of ovary    Diverticulosis 2010   Dysrhythmia    left ventricular systolic dysfunction   HOH (hard of hearing)    right ear   Hyperlipidemia    Hypertension    Lordosis of lumbar region    kyphosis   Other urinary incontinence    Peripheral neuropathy    balls of feet   RBBB (right bundle branch block)    Dr Lenora Boys [phone]   Tremor    UTI (urinary tract infection)    June at [facility] ER   Valvular heart disease    Vertigo    dental office chairs    Surgical History: Past Surgical History:  Procedure Laterality Date   BREAST BIOPSY Right years ago   2 bx. done. Negative results, benign lumpectomy   BREAST EXCISIONAL BIOPSY Right    CATARACT EXTRACTION Bilateral    COLONOSCOPY  2010,2013,2019   diverticulosis   COLONOSCOPY WITH PROPOFOL N/A 09/26/2017   Procedure: COLONOSCOPY WITH PROPOFOL;  Surgeon: Christena Deem, MD;  Location: [facility] ENDOSCOPY;  Service: Endoscopy;  Laterality: N/A;   COLONOSCOPY WITH PROPOFOL N/A 03/20/2021    Procedure: COLONOSCOPY WITH PROPOFOL;  Surgeon: Regis Bill, MD;  Location: [facility] ENDOSCOPY;  Service: Endoscopy;  Laterality: N/A;   DIAGNOSTIC LAPAROSCOPY     EYE SURGERY Bilateral 2008,2013   cataract, right then left   FOOT SURGERY Right 2014   bunionectomy with hammertoe   FOREIGN BODY REMOVAL Left 12/04/2018   Procedure: F/B REMOVAL, COMPLICATED;  Surgeon: Recardo Evangelist, DPM;  Location: [facility];  Service: Podiatry;  Laterality: Left;   removed hammerlock implant and screw x 1   HAMMER TOE SURGERY Left 04/27/2015   Procedure: HAMMER TOE CORRECTION 2ND TOE;  Surgeon: Gwyneth Revels, DPM;  Location: [facility];  Service: Podiatry;  Laterality: Left;   LAPAROSCOPIC BILATERAL SALPINGO OOPHERECTOMY Bilateral 02/08/2020   Procedure: LAPAROSCOPIC BILATERAL SALPINGO OOPHORECTOMY, ABDOMINAL AHESIONS LYSIS;  Surgeon: Schermerhorn, Ihor Austin, MD;  Location: [facility] ORS;  Service: Gynecology;  Laterality: Bilateral;   METATARSAL OSTEOTOMY Left 04/27/2015   Procedure: METATARSAL OSTEOTOMY 1ST METATARSAL AND Proximal phalanx osteotomy.;  Surgeon: Gwyneth Revels, DPM;  Location: [facility];  Service: Podiatry;  Laterality: Left;   MOHS SURGERY  07/10/2018   face and right forearm   RECONSTRUCTION OF ANGULAR DEFORMITY,TOE Left 12/04/2018   Procedure: RECONSTRUCTION OVERLAPPING TOE LEFT;  Surgeon: Recardo Evangelist, DPM;  Location: [facility];  Service: Podiatry;  Laterality: Left;   TUBAL LIGATION      Home Medications:  Allergies as of 03/11/2023       Reactions   Latex Rash   Mangifera Indica Anaphylaxis   Other Swelling   Mango skin - swelling of lips   Shellfish-derived Products Hives   shrimp   Adhesive [tape] Rash   silicone        Medication List        Accurate as of March 11, 2023  2:02 PM. If you have any questions, ask your nurse or doctor.          acetaminophen 325 MG tablet Commonly known as: TYLENOL Take 650 mg by mouth  every 6 (six) hours as needed for moderate pain.   aspirin 81 MG tablet Take 81 mg by mouth at bedtime.   BIOTENE MOISTURIZING MOUTH MT Use as directed 10 mLs in the mouth or throat 2 (two) times daily as needed (dry mouth).   BIOTIN PO Take by mouth daily.   Co Q10 100 MG Caps Take 200 mg by mouth daily.   diclofenac Sodium 1 % Gel Commonly known as: Voltaren Apply 2 g topically 4 (four) times daily as needed (pain).   divalproex 500 MG 24 hr tablet Commonly known as: DEPAKOTE ER Take 1 tablet (500 mg total) by mouth at bedtime.   Ensure Take 237 mLs by mouth.   FLAXSEED OIL PO Take by mouth daily.   gabapentin 300 MG capsule Commonly known as: NEURONTIN Take 2 capsules (600 mg total) by mouth at bedtime.   GARLIC PO Take by mouth daily.   LACTINEX PO Take 1 packet by mouth daily.   losartan 25 MG tablet Commonly known as: COZAAR Take 1 tablet (25 mg total) by mouth daily.   MAGNESIUM CITRATE PO Take 250 mg by mouth at bedtime.   Menthol 1 MG Lozg Use as directed 1 lozenge in the mouth or throat every 2 (two) hours as needed (for cough).   mirabegron ER 25 MG Tb24 tablet Commonly known as: MYRBETRIQ Take 1 tablet (25 mg total) by mouth daily.   polyethylene glycol 17 g packet Commonly known as: MIRALAX / GLYCOLAX Take 17 g by mouth daily.   pravastatin 40 MG tablet Commonly known as: PRAVACHOL Take 1 tablet (40 mg total) by mouth at bedtime.   PreviDent 5000 Sensitive 1.1-5 % Gel Generic drug: Sod Fluoride-Potassium Nitrate Place onto teeth at bedtime.   QUEtiapine 50 MG tablet Commonly known as: SEROQUEL Take 1 tablet (50 mg total) by mouth at bedtime.   sodium fluoride 1.1 % Gel dental gel Commonly known as: FLUORISHIELD Place 1 application onto teeth in the morning and at bedtime.   TURMERIC-GINGER PO Take by mouth daily.        Allergies:  Allergies  Allergen Reactions   Latex Rash   Mangifera Indica Anaphylaxis   Other Swelling     Mango skin - swelling of lips   Shellfish-Derived Products Hives    shrimp   Adhesive [Tape] Rash    silicone    Family History: Family History  Problem Relation Age of Onset   Bipolar disorder Mother    Leukemia Mother    Rheum arthritis Mother    Rheum arthritis Father    Stroke Father    Breast cancer Sister 65    Social History:  reports that she has never smoked. She has never used smokeless tobacco. She  reports current alcohol use of about 1.0 standard drink of alcohol per week. She reports that she does not use drugs.  ROS:                                        Physical Exam: There were no vitals taken for this visit.  Constitutional:  Alert and oriented, No acute distress. HEENT: [place] AT, moist mucus membranes.  Trachea midline, no masses.   Laboratory Data: Lab Results  Component Value Date   WBC 5.2 02/05/2020   HGB 11.7 (L) 02/05/2020   HCT 36.5 02/05/2020   MCV 91.0 02/05/2020   PLT 191 02/05/2020    Lab Results  Component Value Date   CREATININE 1.23 (H) 02/05/2020    No results found for: "PSA"  No results found for: "TESTOSTERONE"  No results found for: "HGBA1C"  Urinalysis    Component Value Date/Time   COLORURINE YELLOW 01/04/2020 1954   APPEARANCEUR HAZY (A) 01/04/2020 1954   LABSPEC 1.010 01/04/2020 1954   PHURINE 7.0 01/04/2020 1954   GLUCOSEU NEGATIVE 01/04/2020 1954   HGBUR MODERATE (A) 01/04/2020 1954   BILIRUBINUR NEGATIVE 01/04/2020 1954   KETONESUR TRACE (A) 01/04/2020 1954   PROTEINUR 30 (A) 01/04/2020 1954   NITRITE POSITIVE (A) 01/04/2020 1954   LEUKOCYTESUR LARGE (A) 01/04/2020 1954    Pertinent Imaging:   Assessment & Plan: Patient has mixed incontinence but probably primarily has an overactive bladder.  Reassess in 6 weeks with pelvic examination and cystoscopy on Gemtesa samples and prescription.  Call if culture positive he may or may not need urodynamics in the future she is a retired  Runner, broadcasting/film/video  1. Stress incontinence of urine   2. Other urinary incontinence  - Urinalysis, Complete   No follow-ups on file.  Martina Sinner, MD  [facility] [address], [address] [place], [place] [address] [phone]

## 2023-03-14 LAB — CULTURE, URINE COMPREHENSIVE

## 2023-04-29 ENCOUNTER — Other Ambulatory Visit: Payer: 59 | Admitting: Urology

## 2023-05-06 ENCOUNTER — Encounter: Payer: Self-pay | Admitting: *Deleted

## 2023-05-13 ENCOUNTER — Other Ambulatory Visit: Payer: 59 | Admitting: Urology

## 2023-07-22 ENCOUNTER — Other Ambulatory Visit: Payer: 59 | Admitting: Urology

## 2023-08-26 ENCOUNTER — Ambulatory Visit: Payer: 59 | Admitting: Urology

## 2023-08-26 ENCOUNTER — Encounter: Payer: Self-pay | Admitting: Urology

## 2023-08-26 VITALS — BP 121/84 | HR 63 | Ht 61.0 in | Wt 133.0 lb

## 2023-08-26 DIAGNOSIS — N3946 Mixed incontinence: Secondary | ICD-10-CM

## 2023-08-26 LAB — URINALYSIS, COMPLETE
Bilirubin, UA: NEGATIVE
Glucose, UA: NEGATIVE
Ketones, UA: NEGATIVE
Leukocytes,UA: NEGATIVE
Nitrite, UA: NEGATIVE
Protein,UA: NEGATIVE
Specific Gravity, UA: 1.015 (ref 1.005–1.030)
Urobilinogen, Ur: 0.2 mg/dL (ref 0.2–1.0)
pH, UA: 7 (ref 5.0–7.5)

## 2023-08-26 LAB — MICROSCOPIC EXAMINATION: Bacteria, UA: NONE SEEN

## 2023-08-26 MED ORDER — GEMTESA 75 MG PO TABS: 75.0000 mg | ORAL_TABLET | Freq: Every day | ORAL | 11 refills | Status: AC

## 2023-08-26 MED ORDER — GEMTESA 75 MG PO TABS: 75.0000 mg | ORAL_TABLET | Freq: Every day | ORAL | Status: AC

## 2023-08-26 NOTE — Progress Notes (Signed)
 08/26/2023 1:23 PM   Tracey Wilson [birth year] [id number]  Referring provider: Marina Goodell, MD [address] [place],  [place] [address]  No chief complaint on file.   HPI: Was consulted to assess the patient's incontinence.  She has urge incontinence.  She leaks with coughing sneezing but not bending lifting.  I do not think she has bedwetting but she has foot on the floor syndrome.  When she takes Myrbetriq 25 mg she sleeps for 12 hours and voids every 1-2 hours during the day.  She wears 4 pads a day that are damp.   Borderline diabetic.  Okay to get a bladder infection.  No hysterectomy  Patient has mixed incontinence but probably primarily has an overactive bladder. Reassess in 6 weeks with pelvic examination and cystoscopy on Gemtesa samples and prescription. Call if culture positive he may or may not need urodynamics in the future she is a retired Runner, broadcasting/film/video   Today Frequency and incontinence and nocturia all improved.  No infections. Grade 2 hypermobility the bladder neck and no stress incontinence.  She says she is sleeping throughout the night.  No more bedwetting Cystoscopy: Patient underwent flexible cystoscopy.  Bladder close and trigone were normal.  No cystitis.  No carcinoma        PMH: Past Medical History:  Diagnosis Date   Abnormal EKG    Arthritis    osteo, multiple sites   Bipolar 1 disorder, mixed (HCC)    Cancer (HCC)    skin-malignant neoplasm,scc   Cataract cortical, senile    Chronic kidney disease    Cyst of ovary    Diverticulosis 2010   Dysrhythmia    left ventricular systolic dysfunction   HOH (hard of hearing)    right ear   Hyperlipidemia    Hypertension    Lordosis of lumbar region    kyphosis   Other urinary incontinence    Peripheral neuropathy    balls of feet   RBBB (right bundle branch block)    Dr Lenora Boys [phone]   Tremor    UTI (urinary tract infection)    June at [facility]   Valvular heart disease    Vertigo     dental office chairs    Surgical History: Past Surgical History:  Procedure Laterality Date   BREAST BIOPSY Right years ago   2 bx. done. Negative results, benign lumpectomy   BREAST EXCISIONAL BIOPSY Right    CATARACT EXTRACTION Bilateral    COLONOSCOPY  2010,2013,2019   diverticulosis   COLONOSCOPY WITH PROPOFOL N/A 09/26/2017   Procedure: COLONOSCOPY WITH PROPOFOL;  Surgeon: Christena Deem, MD;  Location: [facility] ENDOSCOPY;  Service: Endoscopy;  Laterality: N/A;   COLONOSCOPY WITH PROPOFOL N/A 03/20/2021   Procedure: COLONOSCOPY WITH PROPOFOL;  Surgeon: Regis Bill, MD;  Location: [facility] ENDOSCOPY;  Service: Endoscopy;  Laterality: N/A;   DIAGNOSTIC LAPAROSCOPY     EYE SURGERY Bilateral 2008,2013   cataract, right then left   FOOT SURGERY Right 2014   bunionectomy with hammertoe   FOREIGN BODY REMOVAL Left 12/04/2018   Procedure: F/B REMOVAL, COMPLICATED;  Surgeon: Recardo Evangelist, DPM;  Location: [facility] SURGERY CNTR;  Service: Podiatry;  Laterality: Left;   removed hammerlock implant and screw x 1   HAMMER TOE SURGERY Left 04/27/2015   Procedure: HAMMER TOE CORRECTION 2ND TOE;  Surgeon: Gwyneth Revels, DPM;  Location: [facility] SURGERY CN[facility];  Service: Podiatry;  Laterality: Left;   LAPAROSCOPIC BILATERAL SALPINGO OOPHERECTOMY Bilateral 02/08/2020  Procedure: LAPAROSCOPIC BILATERAL SALPINGO OOPHORECTOMY, ABDOMINAL AHESIONS LYSIS;  Surgeon: Schermerhorn, Ihor Austin, MD;  Location: [facility] ORS;  Service: Gynecology;  Laterality: Bilateral;   METATARSAL OSTEOTOMY Left 04/27/2015   Procedure: METATARSAL OSTEOTOMY 1ST METATARSAL AND Proximal phalanx osteotomy.;  Surgeon: Gwyneth Revels, DPM;  Location: [facility] SURGERY CNTR;  Service: Podiatry;  Laterality: Left;   MOHS SURGERY  07/10/2018   face and right forearm   RECONSTRUCTION OF ANGULAR DEFORMITY,TOE Left 12/04/2018   Procedure: RECONSTRUCTION OVERLAPPING TOE LEFT;  Surgeon: Recardo Evangelist, DPM;  Location: [facility] SURGERY CNTR;   Service: Podiatry;  Laterality: Left;   TUBAL LIGATION      Home Medications:  Allergies as of 08/26/2023       Reactions   Latex Rash   Mangifera Indica Anaphylaxis   Other Swelling   Mango skin - swelling of lips   Shellfish-derived Products Hives   shrimp   Adhesive [tape] Rash   silicone        Medication List        Accurate as of August 26, 2023  1:23 PM. If you have any questions, ask your nurse or doctor.          acetaminophen 325 MG tablet Commonly known as: TYLENOL Take 650 mg by mouth every 6 (six) hours as needed for moderate pain.   aspirin 81 MG tablet Take 81 mg by mouth at bedtime.   BIOTENE MOISTURIZING MOUTH MT Use as directed 10 mLs in the mouth or throat 2 (two) times daily as needed (dry mouth).   BIOTIN PO Take by mouth daily.   Co Q10 100 MG Caps Take 200 mg by mouth daily.   diclofenac Sodium 1 % Gel Commonly known as: Voltaren Apply 2 g topically 4 (four) times daily as needed (pain).   divalproex 500 MG 24 hr tablet Commonly known as: DEPAKOTE ER Take 1 tablet (500 mg total) by mouth at bedtime.   Ensure Take 237 mLs by mouth.   FLAXSEED OIL PO Take by mouth daily.   gabapentin 300 MG capsule Commonly known as: NEURONTIN Take 2 capsules (600 mg total) by mouth at bedtime.   GARLIC PO Take by mouth daily.   Gemtesa 75 MG Tabs Generic drug: Vibegron Take 1 tablet (75 mg total) by mouth daily.   LACTINEX PO Take 1 packet by mouth daily.   losartan 25 MG tablet Commonly known as: COZAAR Take 1 tablet (25 mg total) by mouth daily.   MAGNESIUM CITRATE PO Take 250 mg by mouth at bedtime.   Menthol 1 MG Lozg Use as directed 1 lozenge in the mouth or throat every 2 (two) hours as needed (for cough).   mirabegron ER 25 MG Tb24 tablet Commonly known as: MYRBETRIQ Take 1 tablet (25 mg total) by mouth daily.   polyethylene glycol 17 g packet Commonly known as: MIRALAX / GLYCOLAX Take 17 g by mouth daily.    pravastatin 40 MG tablet Commonly known as: PRAVACHOL Take 1 tablet (40 mg total) by mouth at bedtime.   PreviDent 5000 Sensitive 1.1-5 % Gel Generic drug: Sod Fluoride-Potassium Nitrate Place onto teeth at bedtime.   QUEtiapine 50 MG tablet Commonly known as: SEROQUEL Take 1 tablet (50 mg total) by mouth at bedtime.   sodium fluoride 1.1 % Gel dental gel Commonly known as: FLUORISHIELD Place 1 application onto teeth in the morning and at bedtime.   TURMERIC-GINGER PO Take by mouth daily.        Allergies:  Allergies  Allergen Reactions   Latex Rash   Mangifera Indica Anaphylaxis   Other Swelling    Mango skin - swelling of lips   Shellfish-Derived Products Hives    shrimp   Adhesive [Tape] Rash    silicone    Family History: Family History  Problem Relation Age of Onset   Bipolar disorder Mother    Leukemia Mother    Rheum arthritis Mother    Rheum arthritis Father    Stroke Father    Breast cancer Sister 47    Social History:  reports that she has never smoked. She has never used smokeless tobacco. She reports current alcohol use of about 1.0 standard drink of alcohol per week. She reports that she does not use drugs.  ROS:                                        Physical Exam: There were no vitals taken for this visit.  Constitutional:  Alert and oriented, No acute distress. HEENT: Burr AT, moist mucus membranes.  Trachea midline, no masses.   Laboratory Data: Lab Results  Component Value Date   WBC 5.2 02/05/2020   HGB 11.7 (L) 02/05/2020   HCT 36.5 02/05/2020   MCV 91.0 02/05/2020   PLT 191 02/05/2020    Lab Results  Component Value Date   CREATININE 1.23 (H) 02/05/2020    No results found for: "PSA"  No results found for: "TESTOSTERONE"  No results found for: "HGBA1C"  Urinalysis    Component Value Date/Time   COLORURINE YELLOW 01/04/2020 1954   APPEARANCEUR Clear 03/11/2023 1412   LABSPEC 1.010  01/04/2020 1954   PHURINE 7.0 01/04/2020 1954   GLUCOSEU Negative 03/11/2023 1412   HGBUR MODERATE (A) 01/04/2020 1954   BILIRUBINUR Negative 03/11/2023 1412   KETONESUR TRACE (A) 01/04/2020 1954   PROTEINUR Negative 03/11/2023 1412   PROTEINUR 30 (A) 01/04/2020 1954   NITRITE Negative 03/11/2023 1412   NITRITE POSITIVE (A) 01/04/2020 1954   LEUKOCYTESUR Negative 03/11/2023 1412   LEUKOCYTESUR LARGE (A) 01/04/2020 1954    Pertinent Imaging:   Assessment & Plan: Resume Gemtesa and reassess 4 months durability  1. Mixed incontinence (Primary)  - Urinalysis, Complete   No follow-ups on file.  Martina Sinner, MD  [facility] [address] [place], [place] [address] [phone]

## 2023-09-19 NOTE — Progress Notes (Signed)
 Today the history is gathered from: 100% - patient   RECORDS SUMMARY: I have reviewed the note dated 10/28/2019 from Tracey Wilson who has indicated:  Patient with tremor  Given these abnormal neurologic findings, a referral to neurology has been recommended.  REFERRING PHYSICIAN: Jeffie Cheryl Menghini* PRIMARY CARE PHYSICIAN:  Jeffie Cheryl Menghini Mickey., MD   IMPRESSION/PLAN  Tracey Wilson is a 78 y.o. 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 Previously discontinued primidone due to side effects. Gabapentin  is causing daytime drowsiness. Propranolol is considered as an alternative treatment option, requiring monitoring for hypotension and bradycardia. The goal is to manage the tremor to allow continuation of artistic activities. - Continue gabapentin  300 mg at night and add 100 mg in the morning. Discontinue if daytime drowsiness occurs. - Consider propranolol if gabapentin  is ineffective or causes intolerable side effects.  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 Uses OTC foot cream for symptomatic relief. Gabapentin  is also used, which aids neuropathy symptoms without exacerbating the condition. - Continue foot cream for symptomatic relief. - Encourage physical activity and balance exercises to maintain mobility and strength.  Balance Issues Chronic balance issues likely related to neuropathy and lower extremity weakness. Engages in home exercises to improve strength and balance. Physical therapy is considered if issues persist or worsen. - Encourage continued home exercise and balance exercises. - Consider physical therapy if balance issues persist or worsen.  Hand Pain Hand pain following a fall, with osteoarthritis. Reports significant pain and difficulty with hand  movements. An x-ray is needed to assess for fractures or other injuries. - Order hand x-ray to assess for fractures or other injuries. - Consider orthopedic referral if x-ray shows abnormalities.  Follow up in 3 mo  Medications previously tried: Clonazepam Primidone ( loopy, daytime fatigue)  CHIEF COMPLAINT & HPI  Ms. Hole is a 78 y.o. female presenting for evaluation of: No chief complaint on file.   TREMOR/IMBALANCE/WEAKNESS History of Present Illness Tracey Wilson is a 78 year old female with essential tremor who presents for follow-up.  She was last seen in January and was started on primidone 25 mg once daily for one week, then increased to 25 mg twice daily. However, she experienced side effects and discontinued the medication. She was recently started on gabapentin , which causes daytime drowsiness. Her left hand is more affected than the right, impacting her ability to perform daily activities, such as eating and her work as an psychologist, occupational.  She has a history of chronic severe sensory motor polyneuropathy in the lower extremities, which was evaluated with an EMG. She experiences imbalance and fatigue, and uses OTC foot cream for neuropathy, which she finds helpful. Skipping a night of the cream results in increased discomfort. She uses a pedal bike at home for exercise and performs balance exercises to help with her neuropathy and balance issues.  She experiences significant daytime drowsiness, sleeping up to 12 hours at night, which she attributes to gabapentin . She is currently taking gabapentin  300 mg at night and 100 mg in the morning.  She reports a fall in the past, resulting in a hand injury and ongoing pain, which she attributes to osteoarthritis. She has not seen an orthopedic specialist for this issue.  She has a family history of benign familial tremors, inherited from her father.  DATA SUMMARY: 08/12/2023   EMG: Impression: Abnormal study.  There is electrodiagnostic evidence of a chronic, severe sensorimotor polyneuropathy in the lower extremities.    06/02/2022 CT HEAD WO/ CT CERVICAL SPINE WO --No evidence of acute intracranial pathology.  --No fracture or traumatic listhesis of the cervical spine.   01/04/2020 CT HEAD WO IMPRESSION:  No acute intracranial abnormality.   VISIT SUMMARIES:   MEDICATIONS Current Outpatient Medications  Medication Sig Dispense Refill  . aspirin 81 MG EC tablet Take 81 mg by mouth once daily    . BIOTIN ORAL Take 1 capsule by mouth once daily       . cholecalciferol (VITAMIN D3) 1000 unit tablet Take by mouth    . co-enzyme Q-10, ubiquinone, 100 mg capsule Take 200 mg by mouth once daily       . cranberry 400 mg Cap Take by mouth    . DAILY GARLIC ONCE-A-DAY ORAL Take 1,000 mg by mouth once daily       . divalproex  (DEPAKOTE  ER) 500 MG ER tablet TAKE 1 TABLET BY MOUTH ONCE DAILY 90 tablet 3  . gabapentin  (NEURONTIN ) 100 MG capsule Take 100 mg cap +300 mg tab at night to equal 400 mg nightly. 30 capsule 1  . gabapentin  (NEURONTIN ) 300 MG capsule 300 mg at bedtime    . glucos sul 2KCl/msm/chond/C/Mn (GLUCOSAMINE CHONDROITIN ORAL) Take by mouth    . KRILL OIL ORAL Take by mouth    . Lactobacillus acidophilus Cap Take by mouth    . losartan  (COZAAR ) 50 MG tablet Take by mouth    . MAGNESIUM ORAL Take by mouth    . polyethylene glycol (MIRALAX) powder Take 17 g by mouth once daily Mix in 4-8ounces of fluid prior to taking.    . pravastatin  (PRAVACHOL ) 40 MG tablet TAKE 1 TABLET BY MOUTH ONCE  DAILY 90 tablet 3  . PREVIDENT 5000 DRY MOUTH 1.1 % gel Place onto teeth nightly     3  . primidone (MYSOLINE) 50 MG tablet Take 1/2 tablet once daily for one week, then increase to 1/2 tablet twice daily 90 tablet 0  . turm-ging-bos-yuc-wil-cham-hor 100-100-100-125 mg Tab Take 1 capsule by mouth once daily.    . vibegron  (GEMTESA ) 75 mg Tab Take 75 mg by mouth  once daily    . vitamin E 400 UNIT capsule Take 400 Units by mouth once daily    . wheat dextrin (BENEFIBER CLEAR) 3 gram/3.5 gram PwPk packet Take by mouth     No current facility-administered medications for this visit.    ALLERGIES Allergies  Allergen Reactions  . Latex, Natural Rubber Hives  . Mango Anaphylaxis  . Other Swelling    Mango skin - swelling of lips  . Others Unknown    Uncoded Allergy. Allergen: shrimp  . Adhesive Tape-Silicones Rash  . Shellfish Derived Hives     EXAM   There were no vitals filed for this visit.  There is no height or weight on file to calculate BMI.  GENERAL: Very pleasant female.  No distress.  Normocephalic and atraumatic.  Baseline neurological exam below was obtained at prior office visit. Changes from today's visit appear in bold.   EYES: PERRLA EOM's intact  MUSCULOSKELETAL: Bulk - Normal Tone - Normal Pronator Drift - Absent bilaterally.  Ambulation - Gait and station is moderately setting, no shuffling gait,  no tremor with ambulation, no decreased arm swing.   Romberg -not tested for safety  No tremor bilaterally noted  at rest.  Moderate bilateral kinetic tremor with finger to nose.  Moderate postural tremor bilaterally, worse in left hand.   No cogwheeling or rigidity in B/l upper extremities.  No dysdiadochokinesia.  NEUROLOGICAL: MENTAL STATUS: Patient is oriented to person, place and time.   Short-term memory is intact Long-term memory is intact.   Attention span and concentration are intact.   Naming and repetition are intact. Comprehension is intact.   Expressive speech is intact.   Patient's fund of knowledge is within normal limits for educational level.   PAST MEDICAL HISTORY Past Medical History:  Diagnosis Date  . Abnormal EKG   . Allergy 1963   Adhesive, shellfish and mango skins.  . Anemia 1950   [place] in [place]  . Anxiety 1965   [employer]  . Arrhythmia RBBB  . Arthritis   . Benign  essential HTN 10/23/2016  . Bipolar disorder (CMS/HHS-HCC)    sees Dr. Candelaria at [facility] in [place]  . Cataract cortical, senile   . CHF (congestive heart failure) (CMS/HHS-HCC) RBBB  . Chronic kidney disease Stage 3   2020 Dr. Marcelino  . Depression 1965   Bi Polar Dr.  Edith  . Diverticulosis    confirmed by colonoscopy 2010  . GERD (gastroesophageal reflux disease)    Not on a regular basis.  SABRA Heart murmur 2006   RBBB  . History of cancer 2015, 2018,2020   skin. Removed by Dr Bluford [facility] surgery.  . Hyperlipidemia   . Idiopathic peripheral neuropathy 06/01/2015  . Osteoporosis 1995   Osteopina  . Sleep apnea 2014   Not on a regular basis  . Tremor   . Urinary incontinence, mixed 06/01/2015  . Valvular heart disease     PAST SURGICAL HISTORY Past Surgical History:  Procedure Laterality Date  . COLONOSCOPY  2010   diverticulosis  . COLONOSCOPY  2013  . COLONOSCOPY  09/26/2017   2 hyperplastic polyps, 3 tubular adenomas. 3 year repeat MUS  . Laparoscopic bilateral salpingectomy with extensive abdominal and pelvic adhesiolysis  02/08/2020  . COLONOSCOPY  03/20/2021   Tubulovillous adenoma/PHx CP/FHx CP/Repeat 14yrs/CTL  . CATARACT EXTRACTION     Bilateral  . MOHS SURGERY    . TUBAL LIGATION     x2    FAMILY HISTORY Family History  Problem Relation Name Age of Onset  . Bipolar disorder Mother Marni CROME. Walker   . Leukemia Mother Eloisa L. Walker        Myelogenous leukemia  . Rheum arthritis Mother Eloisa L. Walker   . Alzheimer's disease Mother Marni L. Walker        Early On-set Diminsia  . Anxiety Mother Marni L. Walker        Bipolar Disorder  . Depression Mother Marni CROME. Walker   . Stroke Father Lynwood A. LeConte        After a mini stroke, he had a Maxi Stroke  . Rheum arthritis Father Lynwood A. LeConte   . Colon polyps Father Lynwood LABOR. LeConte        Transverse Colon  . Glaucoma Father Lynwood LABOR. LeConte   . Breast cancer Other         Sibling  .  Bipolar disorder Son Anie Juniel   . Breast cancer Sister Marni CROME Jesus   . Glaucoma Sister Marni CROME Jesus   . Colon polyps Son Amalia [place]        biopsy: benign  . Hyperlipidemia (Elevated cholesterol) Son Alm Rebbeca  [place]        Takes a Statin Drug  . High blood pressure (Hypertension) Son Bulah Lurie   . Skin cancer Son Lakea Mittelman     SOCIAL HISTORY  Social History   Tobacco Use  . Smoking status: Never    Passive exposure: Never  . Smokeless tobacco: Never  . Tobacco comments:    I have NEVER smoked or used other smoking products.  Vaping Use  . Vaping status: Never Used  Substance Use Topics  . Alcohol use: Yes    Alcohol/week: 1.0 standard drink of alcohol    Types: 1 Standard drinks or equivalent per week    Comment: One 8-ounce glass of wine per week  . Drug use: No     REVIEW OF SYSTEMS:  13 system ROS was verbally reviewed with patient. Pertinent positives and negatives are mentioned above in the HPI and all other systems are negative.   DATA   Appointment on 09/13/2023  Component Date Value Ref Range Status  . Glucose 09/13/2023 114 (H)  70 - 110 mg/dL Final  . Sodium 96/85/7974 139  136 - 145 mmol/L Final  . Potassium 09/13/2023 4.4  3.6 - 5.1 mmol/L Final  . Chloride 09/13/2023 104  97 - 109 mmol/L Final  . Carbon Dioxide (CO2) 09/13/2023 28.6  22.0 - 32.0 mmol/L Final  . Urea Nitrogen (BUN) 09/13/2023 29 (H)  7 - 25 mg/dL Final  . Creatinine 96/85/7974 1.5 (H)  0.6 - 1.1 mg/dL Final  . Glomerular Filtration Rate (eGFR) 09/13/2023 35 (L)  >60 mL/min/1.73sq m Final  . Calcium 09/13/2023 10.2  8.7 - 10.3 mg/dL Final  . AST  96/85/7974 25  8 - 39 U/L Final  . ALT  09/13/2023 16  5 - 38 U/L Final  . Alk Phos (alkaline Phosphatase) 09/13/2023 65  34 - 104 U/L Final  . Albumin 09/13/2023 4.3  3.5 - 4.8 g/dL Final  . Bilirubin, Total 09/13/2023 0.6  0.3 - 1.2 mg/dL Final  . Protein, Total 09/13/2023 6.9  6.1 -  7.9 g/dL Final  . A/G Ratio 96/85/7974 1.7  1.0 - 5.0 gm/dL Final  . Cholesterol, Total 09/13/2023 161  100 - 200 mg/dL Final  . Triglyceride 96/85/7974 151  35 - 199 mg/dL Final  . HDL (High Density Lipoprotein) Cho* 09/13/2023 61.6  35.0 - 85.0 mg/dL Final  . LDL Calculated 09/13/2023 69  0 - 130 mg/dL Final  . VLDL Cholesterol 09/13/2023 30  mg/dL Final  . Cholesterol/HDL Ratio 09/13/2023 2.6   Final  . Hemoglobin A1C 09/13/2023 6.4 (H)  4.2 - 5.6 % Final  . Average Blood Glucose (Calc) 09/13/2023 137  mg/dL Final  Office Visit on 05/06/2023  Component Date Value Ref Range Status  . Thyroid Stimulating Hormone (TSH) 05/06/2023 0.458  0.450-5.330 uIU/ml uIU/mL Final    No follow-ups on file.  Payor: [facility] MEDICARE ADVANTAGE PLAN / Plan: UHC DUAL COMPLETE HMO POS SNP / Product Type: HMO /    MEGAN SIMPSON, CMA    I agree that the scribe documentation is complete and accurate.  This note was generated in part with voice recognition software and I apologize for any typographical errors that were not detected and corrected.     Attestation Statement:   I personally performed the service, non-incident to. [facility])   SARAH ELIZABETH MASON, PA       This note has been created using automated tools and reviewed for accuracy by [facility]  ELIZABETH MASON.

## 2023-12-23 ENCOUNTER — Ambulatory Visit (INDEPENDENT_AMBULATORY_CARE_PROVIDER_SITE_OTHER): Payer: 59 | Admitting: Urology

## 2023-12-23 VITALS — BP 124/65 | HR 71 | Ht 61.0 in | Wt 130.5 lb

## 2023-12-23 DIAGNOSIS — N3946 Mixed incontinence: Secondary | ICD-10-CM | POA: Diagnosis not present

## 2023-12-23 DIAGNOSIS — N393 Stress incontinence (female) (male): Secondary | ICD-10-CM

## 2023-12-23 MED ORDER — GEMTESA 75 MG PO TABS: 75.0000 mg | ORAL_TABLET | Freq: Every day | ORAL | 3 refills | Status: DC

## 2023-12-23 NOTE — Progress Notes (Signed)
 12/23/2023 1:29 PM   Slater LITTIE Pack [birth year] [id number]  Referring provider: Jeffie Cheryl BRAVO, MD [address] [place],  [place] [address]  Chief Complaint  Patient presents with   Follow-up   Urinary Incontinence    HPI: Was consulted to assess the patient's incontinence.  She has urge incontinence.  She leaks with coughing sneezing but not bending lifting.  I do not think she has bedwetting but she has foot on the floor syndrome.  When she takes Myrbetriq  25 mg she sleeps for 12 hours and voids every 1-2 hours  during the day.  She wears 4 pads a day that are damp.   Borderline diabetic.  Okay to get a bladder infection.  No hysterectomy   Patient has mixed incontinence but probably primarily has an overactive bladder. Reassess in 6 weeks with pelvic examination and cystoscopy on Gemtesa  samples and prescription. Call if culture positive he may or may not need urodynamics in the future she is a retired Runner, broadcasting/film/video    Today Frequency and incontinence and nocturia all improved.  No infections. Grade 2 hypermobility the bladder neck and no stress incontinence.  She says she is sleeping throughout the night.  No more bedwetting Cystoscopy: Patient underwent flexible cystoscopy.  Bladder close and trigone were normal.  No cystitis.  No carcinoma  Resume Gemtesa  and reassess 4 months durability   Today Patient has excellent control with no urge incontinence sleeps through the night totally.  Very happy.  No infections  PMH: Past Medical History:  Diagnosis Date   Abnormal EKG    Arthritis    osteo, multiple sites   Bipolar 1 disorder, mixed (HCC)    Cancer (HCC)    skin-malignant neoplasm,scc   Cataract cortical, senile    Chronic kidney disease    Cyst of ovary    Diverticulosis 2010   Dysrhythmia    left ventricular systolic dysfunction   HOH (hard of hearing)    right ear   Hyperlipidemia    Hypertension    Lordosis of lumbar region    kyphosis   Other urinary  incontinence    Peripheral neuropathy    balls of feet   RBBB (right bundle branch block)    Dr Hester LAMY [phone]   Tremor    UTI (urinary tract infection)    June at [facility] ER   Valvular heart disease    Vertigo    dental office chairs    Surgical History: Past Surgical History:  Procedure Laterality Date   BREAST BIOPSY Right years ago   2 bx. done. Negative results, benign lumpectomy   BREAST EXCISIONAL BIOPSY Right    CATARACT EXTRACTION Bilateral    COLONOSCOPY  2010,2013,2019   diverticulosis   COLONOSCOPY WITH PROPOFOL  N/A 09/26/2017   Procedure: COLONOSCOPY WITH PROPOFOL ;  Surgeon: Gaylyn Gladis PENNER, MD;  Location: [facility] ENDOSCOPY;  Service: Endoscopy;  Laterality: N/A;   COLONOSCOPY WITH PROPOFOL  N/A 03/20/2021   Procedure: COLONOSCOPY WITH PROPOFOL ;  Surgeon: Maryruth Ole DASEN, MD;  Location: [facility] ENDOSCOPY;  Service: Endoscopy;  Laterality: N/A;   DIAGNOSTIC LAPAROSCOPY     EYE SURGERY Bilateral 2008,2013   cataract, right then left   FOOT SURGERY Right 2014   bunionectomy with hammertoe   FOREIGN BODY REMOVAL Left 12/04/2018   Procedure: F/B REMOVAL, COMPLICATED;  Surgeon: Lilli Cough, DPM;  Location: [facility] SURGERY [facility]TR;  Service: Podiatry;  Laterality: Left;   removed hammerlock implant and screw x 1   HAMMER TOE SURGERY  Left 04/27/2015   Procedure: HAMMER TOE CORRECTION 2ND TOE;  Surgeon: Eva Gay, DPM;  Location: [facility]GERY [facility];  Service: Podiatry;  Laterality: Left;   LAPAROSCOPIC BILATERAL SALPINGO OOPHERECTOMY Bilateral 02/08/2020   Procedure: LAPAROSCOPIC BILATERAL SALPINGO OOPHORECTOMY, ABDOMINAL AHESIONS LYSIS;  Surgeon: Schermerhorn, Debby PARAS, MD;  Location: [facility] ORS;  Service: Gynecology;  Laterality: Bilateral;   METATARSAL OSTEOTOMY Left 04/27/2015   Procedure: METATARSAL OSTEOTOMY 1ST METATARSAL AND Proximal phalanx osteotomy.;  Surgeon: Eva Gay, DPM;  Location: [facility] SURGERY [facility];  Service: Podiatry;  Laterality: Left;    MOHS SURGERY  07/10/2018   face and right forearm   RECONSTRUCTION OF ANGULAR DEFORMITY,TOE Left 12/04/2018   Procedure: RECONSTRUCTION OVERLAPPING TOE LEFT;  Surgeon: Lilli Cough, DPM;  Location: [facility] SUR[facility]ERY [facility];  Service: Podiatry;  Laterality: Left;   TUBAL LIGATION      Home Medications:  Allergies as of 12/23/2023       Reactions   Latex Rash   Mangifera Indica Anaphylaxis   Other Swelling   Mango skin - swelling of lips   Shellfish-derived Products Hives   shrimp   Adhesive [tape] Rash   silicone        Medication List        Accurate as of December 23, 2023  1:29 PM. If you have any questions, ask your nurse or doctor.          STOP taking these medications    diclofenac  Sodium 1 % Gel Commonly known as: Voltaren  Stopped by: Glendia A Blinda Turek   FLAXSEED OIL PO Stopped by: Glendia A Durene Dodge   GARLIC PO Stopped by: Glendia A Nikiesha Milford   Menthol 1 MG Lozg Stopped by: Glendia A Hinda Lindor   sodium fluoride 1.1 % Gel dental gel Commonly known as: FLUORISHIELD Stopped by: Glendia A Azalyn Sliwa       TAKE these medications    acetaminophen  325 MG tablet Commonly known as: TYLENOL  Take 650 mg by mouth every 6 (six) hours as needed for moderate pain.   aspirin 81 MG tablet Take 81 mg by mouth at bedtime.   BIOTIN PO Take by mouth daily.   Co Q10 100 MG Caps Take 200 mg by mouth daily.   divalproex  500 MG 24 hr tablet Commonly known as: DEPAKOTE  ER Take 1 tablet (500 mg total) by mouth at bedtime.   Ensure Take 237 mLs by mouth.   gabapentin  300 MG capsule Commonly known as: NEURONTIN  Take 2 capsules (600 mg total) by mouth at bedtime.   Gemtesa  75 MG Tabs Generic drug: Vibegron  Take 1 tablet (75 mg total) by mouth daily.   LACTINEX PO Take 1 packet by mouth daily.   losartan  25 MG tablet Commonly known as: COZAAR  Take 1 tablet (25 mg total) by mouth daily.   MAGNESIUM CITRATE PO Take 250 mg by mouth at bedtime.   mirabegron   ER 25 MG Tb24 tablet Commonly known as: MYRBETRIQ  Take 1 tablet (25 mg total) by mouth daily.   polyethylene glycol 17 g packet Commonly known as: MIRALAX / GLYCOLAX Take 17 g by mouth daily.   pravastatin  40 MG tablet Commonly known as: PRAVACHOL  Take 1 tablet (40 mg total) by mouth at bedtime.   PreviDent 5000 Sensitive 1.1-5 % Gel Generic drug: Sod Fluoride-Potassium Nitrate  Place onto teeth at bedtime.   QUEtiapine  50 MG tablet Commonly known as: SEROQUEL  Take 1 tablet (50 mg total) by mouth at bedtime.   TURMERIC-GINGER PO Take by mouth daily.  Allergies:  Allergies  Allergen Reactions   Latex Rash   Mangifera Indica Anaphylaxis   Other Swelling    Mango skin - swelling of lips   Shellfish-Derived Products Hives    shrimp   Adhesive [Tape] Rash    silicone    Family History: Family History  Problem Relation Age of Onset   Bipolar disorder Mother    Leukemia Mother    Rheum arthritis Mother    Rheum arthritis Father    Stroke Father    Breast cancer Sister 22    Social History:  reports that she has never smoked. She has never used smokeless tobacco. She reports current alcohol use of about 1.0 standard drink of alcohol per week. She reports that she does not use drugs.  ROS:                                        Physical Exam: There were no vitals taken for this visit.  Constitutional:  Alert and oriented, No acute distress. HEENT: [place] AT, moist mucus membranes.  Trachea midline, no masses.   Laboratory Data: Lab Results  Component Value Date   WBC 5.2 02/05/2020   HGB 11.7 (L) 02/05/2020   HCT 36.5 02/05/2020   MCV 91.0 02/05/2020   PLT 191 02/05/2020    Lab Results  Component Value Date   CREATININE 1.23 (H) 02/05/2020    No results found for: PSA  No results found for: TESTOSTERONE  No results found for: HGBA1C  Urinalysis    Component Value Date/Time   COLORURINE YELLOW 01/04/2020 1954    APPEARANCEUR Clear 08/26/2023 1325   LABSPEC 1.010 01/04/2020 1954   PHURINE 7.0 01/04/2020 1954   GLUCOSEU Negative 08/26/2023 1325   HGBUR MODERATE (A) 01/04/2020 1954   BILIRUBINUR Negative 08/26/2023 1325   KETONESUR TRACE (A) 01/04/2020 1954   PROTEINUR Negative 08/26/2023 1325   PROTEINUR 30 (A) 01/04/2020 1954   NITRITE Negative 08/26/2023 1325   NITRITE POSITIVE (A) 01/04/2020 1954   LEUKOCYTESUR Negative 08/26/2023 1325   LEUKOCYTESUR LARGE (A) 01/04/2020 1954    Pertinent Imaging:   Assessment & Plan: At patient's request she wanted to 100 tablets of 3 refills and I will see in 1 year.  1. Mixed incontinence (Primary)  - Urinalysis, Complete  2. Stress incontinence of urine  - Urinalysis, Complete   No follow-ups on file.  Glendia DELENA Elizabeth, MD  [facility] [address] [place], [place] [address] [phone]

## 2024-05-06 NOTE — Progress Notes (Signed)
 Today the history is gathered from: 100% - patient   RECORDS SUMMARY: I have reviewed the note dated 10/28/2019 from Rosina Dys who has indicated:  Patient with tremor  Given these abnormal neurologic findings, a referral to neurology has been recommended.  REFERRING PHYSICIAN: Jeffie Cheryl Menghini* PRIMARY CARE PHYSICIAN:  Jeffie Cheryl Menghini Mickey., MD   IMPRESSION/PLAN  Ms. Ciulla is a 78 y.o. female presenting for evaluation of Assessment & Plan Essential tremor Chronic essential tremor primarily affecting the left hand, impacting daily activities such as holding a glass. Gabapentin  300 mg at night is insufficient for daytime control. Previous intolerance to primidone due to severe side effects. Non-pharmacological interventions such as weighted silverware and bracelets discussed as potential aids. - Increased gabapentin  to 100 mg in the morning in addition to 300 mg at night. - Recommended trying weighted silverware and bracelets to help suppress tremor.  Balance impairment with repeated falls Balance impairment with frequent falls, exacerbated by weakness following hospitalization for influenza and urinary tract infection. Engaged in chair exercises at the [employer] [facility]. Open to physical therapy to improve strength and balance. - Referred to physical therapy to focus on strength and balance improvement. - Continue chair exercises at the [employer] [facility].  Recording duration: 14 minutes  Follow up in 3 mo  Medications previously tried: Clonazepam Primidone ( loopy, daytime fatigue)  CHIEF COMPLAINT & HPI  Ms. Calzada is a 78 y.o. female presenting for evaluation of: Chief Complaint  Patient presents with  . Tremors  . Idiopathic Peripheral Neuropathy  . Mobility/balance Issues  . Altered Mental Status  . Hand Pain    TREMOR/IMBALANCE/WEAKNESS History of Present Illness  Yazleen Molock is a 78 year old female with essential tremor  who presents for management of her condition.  She takes gabapentin  300 mg nightly for her essential tremor. She previously tried primidone but experienced a severe reaction, including inability to walk, after taking 25 mg, leading to discontinuation of the medication. Her psychiatrist assisted in disposing of the remaining primidone tablets.  Her tremor is longstanding, affecting her left hand, which impacts her ability to perform tasks such as pouring liquids. Her father also had similar tremor issues, indicating a familial component.  She experiences balance issues and frequent falls. She has been participating in chair exercises at the [facility] and [facility] for the past four weeks to improve her balance and strength. She describes a specific incident where she fell on her knees while at home, which triggered her Life Protect device to alert her son for assistance.  She has a history of neuropathy and was hospitalized last December for influenza and a urinary tract infection, which resulted in a loss of strength in her arms and legs. She uses her arms to assist in standing from seated positions, especially in settings without armrests.  Her current medication regimen includes gabapentin  300 mg at night.   DATA SUMMARY: 08/12/2023  EMG: Impression: Abnormal study.  There is electrodiagnostic evidence of a chronic, severe sensorimotor polyneuropathy in the lower extremities.    06/02/2022 CT HEAD WO/ CT CERVICAL SPINE WO --No evidence of acute intracranial pathology.  --No fracture or traumatic listhesis of the cervical spine.   01/04/2020 CT HEAD WO IMPRESSION:  No acute intracranial abnormality.   VISIT SUMMARIES:   MEDICATIONS Current Outpatient Medications  Medication Sig Dispense Refill  . aspirin 81 MG EC tablet Take 81 mg by mouth once daily    .  BIOTIN ORAL Take 1 capsule by mouth once daily       . co-enzyme Q-10, ubiquinone, 100 mg capsule Take 200 mg by  mouth once daily       . COLLAGEN MISC Use    . cranberry 400 mg Cap Take by mouth    . divalproex  (DEPAKOTE  ER) 500 MG ER tablet TAKE 1 TABLET BY MOUTH ONCE DAILY 90 tablet 3  . gabapentin  (NEURONTIN ) 300 MG capsule 300 mg at bedtime    . glucos sul 2KCl/msm/chond/C/Mn (GLUCOSAMINE CHONDROITIN ORAL) Take by mouth    . KRILL OIL ORAL Take by mouth    . Lactobacillus acidophilus Cap Take by mouth    . losartan  (COZAAR ) 25 MG tablet Take 25 mg by mouth once daily    . MAGNESIUM ORAL Take by mouth    . mirabegron  (MYRBETRIQ  ORAL) Take 25 mg by mouth    . polyethylene glycol (MIRALAX) powder Take 17 g by mouth once daily Mix in 4-8ounces of fluid prior to taking.    . pravastatin  (PRAVACHOL ) 40 MG tablet TAKE 1 TABLET BY MOUTH ONCE  DAILY 100 tablet 2  . vitamin E 400 UNIT capsule Take 400 Units by mouth once daily    . wheat dextrin (BENEFIBER CLEAR) 3 gram/3.5 gram PwPk packet Take by mouth    . cholecalciferol (VITAMIN D3) 1000 unit tablet Take by mouth (Patient not taking: Reported on 05/06/2024)    . vibegron  (GEMTESA ) 75 mg Tab Take 75 mg by mouth once daily (Patient not taking: Reported on 05/06/2024)     No current facility-administered medications for this visit.    ALLERGIES Allergies  Allergen Reactions  . Latex, Natural Rubber Hives  . Mango Anaphylaxis  . Other Swelling    Mango skin - swelling of lips  . Others Unknown    Uncoded Allergy. Allergen: shrimp  . Adhesive Tape-Silicones Rash  . Shellfish Derived Hives     EXAM   Vitals:   05/06/24 1409  Weight: 60.3 kg (133 lb)  Height: 154.9 cm (5' 1)    Body mass index is 25.13 kg/m.  GENERAL: Very pleasant female.  No distress.  Normocephalic and atraumatic.  Baseline neurological exam below was obtained at prior office visit. Changes from today's visit appear in bold.   EYES: PERRLA EOM's intact  MUSCULOSKELETAL: Bulk - Normal Tone - Normal Pronator Drift - Absent bilaterally.  Ambulation - Gait and  station is moderately setting, no shuffling gait,  no tremor with ambulation, no decreased arm swing.   Romberg -not tested for safety  No tremor bilaterally noted at rest.  Moderate bilateral kinetic tremor with finger to nose.  Moderate postural tremor bilaterally, worse in left hand.   No cogwheeling or rigidity in B/l upper extremities.  No dysdiadochokinesia.  NEUROLOGICAL: MENTAL STATUS: Patient is oriented to person, place and time.   Short-term memory is intact Long-term memory is intact.   Attention span and concentration are intact.   Naming and repetition are intact. Comprehension is intact.   Expressive speech is intact.   Patient's fund of knowledge is within normal limits for educational level.   PAST MEDICAL HISTORY Past Medical History:  Diagnosis Date  . Abnormal EKG   . Allergy 1963   Adhesive, shellfish and mango skins.  . Anemia 1950   [place] in [place]  . Anxiety 1965   [employer]  . Arrhythmia RBBB  . Arthritis   . Benign essential HTN 10/23/2016  . Bipolar disorder (  CMS/HHS-HCC)    sees Dr. Candelaria at [facility] in [place]  . Cataract cortical, senile   . CHF (congestive heart failure) (CMS/HHS-HCC) RBBB  . Chronic kidney disease Stage 3   2020 Dr. Marcelino  . Depression 1965   Bi [employer] Dr.  Edith  . Diverticulosis    confirmed by colonoscopy 2010  . GERD (gastroesophageal reflux disease)    Not on a regular basis.  SABRA Heart murmur 2006   RBBB  . History of cancer 2015, 2018,2020   skin. Removed by Dr Bluford [facility] surgery.  . Hyperlipidemia   . Idiopathic peripheral neuropathy 06/01/2015  . Osteoporosis 1995   Osteopina  . Sleep apnea 2014   Not on a regular basis  . Tremor   . Urinary incontinence, mixed 06/01/2015  . Valvular heart disease     PAST SURGICAL HISTORY Past Surgical History:  Procedure Laterality Date  . COLONOSCOPY  2010   diverticulosis  . COLONOSCOPY  2013  . COLONOSCOPY  09/26/2017   2 hyperplastic polyps, 3 tubular  adenomas. 3 year repeat MUS  . Laparoscopic bilateral salpingectomy with extensive abdominal and pelvic adhesiolysis  02/08/2020  . COLONOSCOPY  03/20/2021   Tubulovillous adenoma/PHx CP/FHx CP/Repeat 24yrs/CTL  . CATARACT EXTRACTION     Bilateral  . MOHS SURGERY    . TUBAL LIGATION     x2    FAMILY HISTORY Family History  Problem Relation Name Age of Onset  . Bipolar disorder Mother Marni CROME. Walker   . Leukemia Mother Eloisa L. Walker        Myelogenous leukemia  . Rheum arthritis Mother Eloisa L. Walker   . Alzheimer's disease Mother Marni L. Walker        Early On-set Diminsia  . Anxiety Mother Marni L. Walker        Bipolar Disorder  . Depression Mother Marni CROME. Walker   . Stroke Father Lynwood A. LeConte        After a mini stroke, he had a Maxi Stroke  . Rheum arthritis Father Lynwood A. LeConte   . Colon polyps Father Lynwood LABOR. LeConte        Transverse Colon  . Glaucoma Father Lynwood LABOR. LeConte   . Breast cancer Other         Sibling  . Bipolar disorder Son Portia Wisdom   . Breast cancer Sister Marni CROME Jesus   . Glaucoma Sister Marni CROME Jesus   . Colon polyps Son Adisen Bennion        biopsy: benign  . Hyperlipidemia (Elevated cholesterol) Son Kathyjo Briere        Takes a Statin Drug  . High blood pressure (Hypertension) Son Lavera Vandermeer   . Skin cancer Son Willene Holian     SOCIAL HISTORY  Social History   Tobacco Use  . Smoking status: Never    Passive exposure: Never  . Smokeless tobacco: Never  . Tobacco comments:    I have NEVER smoked or used other smoking products.  Vaping Use  . Vaping status: Never Used  Substance Use Topics  . Alcohol use: Yes    Alcohol/week: 1.0 standard drink of alcohol    Types: 1 Standard drinks or equivalent per week    Comment: One 8-ounce glass of wine per week  . Drug use: No     REVIEW OF SYSTEMS:  13 system ROS was verbally reviewed with patient. Pertinent positives and  negatives are mentioned above in  the HPI and all other systems are negative.   DATA   Office Visit on 05/04/2024  Component Date Value Ref Range Status  . Thyroid Stimulating Hormone (TSH) 05/04/2024 0.347 (L)  0.450-5.330 uIU/ml uIU/mL Final  Appointment on 04/14/2024  Component Date Value Ref Range Status  . Glucose 04/14/2024 117 (H)  70 - 110 mg/dL Final  . Sodium 89/85/7974 141  136 - 145 mmol/L Final  . Potassium 04/14/2024 5.1  3.6 - 5.1 mmol/L Final  . Chloride 04/14/2024 104  97 - 109 mmol/L Final  . Carbon Dioxide (CO2) 04/14/2024 26.3  22.0 - 32.0 mmol/L Final  . Urea Nitrogen (BUN) 04/14/2024 28 (H)  7 - 25 mg/dL Final  . Creatinine 89/85/7974 2.0 (H)  0.6 - 1.1 mg/dL Final  . Glomerular Filtration Rate (eGFR) 04/14/2024 25 (L)  >60 mL/min/1.73sq m Final  . Calcium 04/14/2024 10.3  8.7 - 10.3 mg/dL Final  . AST  89/85/7974 20  8 - 39 U/L Final  . ALT  04/14/2024 15  5 - 38 U/L Final  . Alk Phos (alkaline Phosphatase) 04/14/2024 61  34 - 104 U/L Final  . Albumin 04/14/2024 4.2  3.5 - 4.8 g/dL Final  . Bilirubin, Total 04/14/2024 0.4  0.3 - 1.2 mg/dL Final  . Protein, Total 04/14/2024 6.7  6.1 - 7.9 g/dL Final  . A/G Ratio 89/85/7974 1.7  1.0 - 5.0 gm/dL Final  . Cholesterol, Total 04/14/2024 147  100 - 200 mg/dL Final  . Triglyceride 89/85/7974 171  35 - 199 mg/dL Final  . HDL (High Density Lipoprotein) Cho* 04/14/2024 57.0  35.0 - 85.0 mg/dL Final  . LDL Calculated 04/14/2024 56  0 - 130 mg/dL Final  . VLDL Cholesterol 04/14/2024 34  mg/dL Final  . Cholesterol/HDL Ratio 04/14/2024 2.6   Final  . Hemoglobin A1C 04/14/2024 6.5 (H)  4.2 - 5.6 % Final  . Average Blood Glucose (Calc) 04/14/2024 140  mg/dL Final    No follow-ups on file.  Payor: [facility] MEDICARE ADVANTAGE PLAN / Plan: UHC DUAL COMPLETE HMO POS SNP / Product Type: HMO /    MEGAN SIMPSON, CMA   I agree that the scribe documentation is complete and accurate.  This note was generated in part with voice  recognition software and I apologize for any typographical errors that were not detected and corrected.     Attestation Statement:   I personally performed the service, non-incident to. [facility])   SARAH ELIZABETH MASON, PA       This note has been created using automated tools and reviewed for accuracy by [facility].

## 2024-06-03 ENCOUNTER — Ambulatory Visit: Attending: Physician Assistant

## 2024-06-03 DIAGNOSIS — R296 Repeated falls: Secondary | ICD-10-CM | POA: Diagnosis present

## 2024-06-03 DIAGNOSIS — R2689 Other abnormalities of gait and mobility: Secondary | ICD-10-CM | POA: Diagnosis present

## 2024-06-03 NOTE — Therapy (Signed)
 OUTPATIENT PHYSICAL THERAPY LOWER EXTREMITY EVALUATION   Patient Name: Tracey Wilson MRN: [id number] DOB:09-21-1945, 78 y.o., female Today's Date: 06/03/2024  END OF SESSION:  PT End of Session - 06/03/24 1626     Visit Number 1    Number of Visits 17    Date for Recertification  07/29/24    Authorization Type PN at visit #10; 2x/week 8 weeks POC    PT Start Time 1630    PT Stop Time 1715    PT Time Calculation (min) 45 min    Equipment Utilized During Treatment Gait belt    Activity Tolerance Patient tolerated treatment well    Behavior During Therapy WFL for tasks assessed/performed          Past Medical History:  Diagnosis Date   Abnormal EKG    Arthritis    osteo, multiple sites   Bipolar 1 disorder, mixed (HCC)    Cancer (HCC)    skin-malignant neoplasm,scc   Cataract cortical, senile    Chronic kidney disease    Cyst of ovary    Diverticulosis 2010   Dysrhythmia    left ventricular systolic dysfunction   HOH (hard of hearing)    right ear   Hyperlipidemia    Hypertension    Lordosis of lumbar region    kyphosis   Other urinary incontinence    Peripheral neuropathy    balls of feet   RBBB (right bundle branch block)    Dr Hester LAMY [phone]   Tremor    UTI (urinary tract infection)    June at [facility] ER   Valvular heart disease    Vertigo    dental office chairs   Past Surgical History:  Procedure Laterality Date   BREAST BIOPSY Right years ago   2 bx. done. Negative results, benign lumpectomy   BREAST EXCISIONAL BIOPSY Right    CATARACT EXTRACTION Bilateral    COLONOSCOPY  2010,2013,2019   diverticulosis   COLONOSCOPY WITH PROPOFOL  N/A 09/26/2017   Procedure: COLONOSCOPY WITH PROPOFOL ;  Surgeon: Gaylyn Gladis PENNER, MD;  Location: [facility] ENDOSCOPY;  Service: Endoscopy;  Laterality: N/A;   COLONOSCOPY WITH PROPOFOL  N/A 03/20/2021   Procedure: COLONOSCOPY WITH PROPOFOL ;  Surgeon: Maryruth Ole DASEN, MD;  Location: [facility] ENDOSCOPY;  Service:  Endoscopy;  Laterality: N/A;   DIAGNOSTIC LAPAROSCOPY     EYE SURGERY Bilateral 2008,2013   cataract, right then left   FOOT SURGERY Right 2014   bunionectomy with hammertoe   FOREIGN BODY REMOVAL Left 12/04/2018   Procedure: F/B REMOVAL, COMPLICATED;  Surgeon: Lilli Cough, DPM;  Location: [facility] SURGERY CNTR;  Service: Podiatry;  Laterality: Left;   removed hammerlock implant and screw x 1   HAMMER TOE SURGERY Left 04/27/2015   Procedure: HAMMER TOE CORRECTION 2ND TOE;  Surgeon: Eva Gay, DPM;  Location: [facility] SURGERY CNTR;  Service: Podiatry;  Laterality: Left;   LAPAROSCOPIC BILATERAL SALPINGO OOPHERECTOMY Bilateral 02/08/2020   Procedure: LAPAROSCOPIC BILATERAL SALPINGO OOPHORECTOMY, ABDOMINAL AHESIONS LYSIS;  Surgeon: Schermerhorn, Debby PARAS, MD;  Location: [facility] ORS;  Service: Gynecology;  Laterality: Bilateral;   METATARSAL OSTEOTOMY Left 04/27/2015   Procedure: METATARSAL OSTEOTOMY 1ST METATARSAL AND Proximal phalanx osteotomy.;  Surgeon: Eva Gay, DPM;  Location: [facility] SURGERY CNTR;  Service: Podiatry;  Laterality: Left;   MOHS SURGERY  07/10/2018   face and right forearm   RECONSTRUCTION OF ANGULAR DEFORMITY,TOE Left 12/04/2018   Procedure: RECONSTRUCTION OVERLAPPING TOE LEFT;  Surgeon: Lilli Cough, DPM;  Location: [facility] SURGERY CNTR;  Service: Podiatry;  Laterality: Left;   TUBAL LIGATION     Patient Active Problem List   Diagnosis Date Noted   Real time reverse transcriptase PCR positive for severe acute respiratory syndrome coronavirus 2 (SARS-CoV-2) 01/23/2021   UTI (urinary tract infection) 01/23/2021   Bipolar 1 disorder, manic, full remission 03/30/2020   Cyst of ovary 03/30/2020   Mixed hyperlipidemia 03/30/2020   RBBB 03/30/2020   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) 03/30/2020   Falls 01/05/2020   Type 2 diabetes mellitus (HCC) 01/05/2020   Tremor of both hands 09/16/2019   Multinodular goiter 10/24/2018   Atrophic kidney,  acquired 05/02/2017   Incomplete emptying of bladder 05/02/2017   Overactive detrusor 05/02/2017   Benign essential HTN 10/23/2016   Chronic kidney disease, stage 3 unspecified (HCC) 04/10/2016   LVH (left ventricular hypertrophy) due to hypertensive disease, without heart failure 04/10/2016   Idiopathic peripheral neuropathy 06/01/2015   Arthritis involving multiple sites 03/24/2014   Depressed bipolar I disorder (HCC) 03/24/2014   Personal history of other malignant neoplasm of skin 12/22/2013    PCP: Dr. Jeffie, MD  REFERRING PROVIDER: [facility]  REFERRING DIAG:  Diagnosis  R25.1 (ICD-10-CM) - Tremor  R26.89 (ICD-10-CM) - Imbalance  R53.1 (ICD-10-CM) - Weakness    THERAPY DIAG:  Repeated falls  Balance problem  Rationale for Evaluation and Treatment: Rehabilitation  ONSET DATE: chronic  SUBJECTIVE:   SUBJECTIVE STATEMENT: Pt states she feels like she lost some of her muscle strength this year; she was hospitalized (2 weeks including inpatient rehab) for influenza last December and she feels like she has been weak since then.    A few weeks ago she was mopping up some water from her tub while squat/bending over and she went down on her knees and couldn't get up- her life alert went off and her son was called and he came and helped her up.  She goes 1x/week for a chair exercise class weekly in [place]   PERTINENT HISTORY: Per chart review from neurology 05/06/24: She takes gabapentin  300 mg nightly for her essential tremor. She previously tried primidone but experienced a severe reaction, including inability to walk, after taking 25 mg, leading to discontinuation of the medication. Her psychiatrist assisted in disposing of the remaining primidone tablets.   Her tremor is longstanding, affecting her left hand, which impacts her ability to perform tasks such as pouring liquids. Her father also had similar tremor issues, indicating a familial component.   She  experiences balance issues and frequent falls. She has been participating in chair exercises at the [facility] for the past four weeks to improve her balance and strength. She describes a specific incident where she fell on her knees while at home, which triggered her Life Protect device to alert her son for assistance.   She has a history of neuropathy and was hospitalized last December for influenza and a urinary tract infection, which resulted in a loss of strength in her arms and legs. She uses her arms to assist in standing from seated positions, especially in settings without armrests.   Her current medication regimen includes gabapentin  300 mg at night.   PAIN:  Are you having pain? Not currently  PRECAUTIONS: Fall  RED FLAGS: None   WEIGHT BEARING RESTRICTIONS: No  FALLS:  Has patient fallen in last 6 months? Yes. 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  DIAGNOSTIC FINDINGS:  Per chart review: 08/12/2023  EMG: Impression: Abnormal study.  There is electrodiagnostic evidence of a chronic, severe sensorimotor polyneuropathy in the lower extremities.    06/02/2022 CT HEAD WO/ CT CERVICAL SPINE WO --No evidence of acute intracranial pathology.   --No fracture or traumatic listhesis of the cervical spine.     PATIENT SURVEYS:  ABC: 45%  COGNITION: Overall cognitive status: Within functional limits for tasks assessed     SENSATION: WFL  POSTURE: rounded shoulders  PALPATION: Deferred today  LOWER EXTREMITY ROM:  Active ROM Right eval Left eval  Hip flexion    Hip extension    Hip abduction    Hip adduction    Hip internal rotation    Hip external rotation    Knee flexion    Knee extension    Ankle dorsiflexion    Ankle plantarflexion    Ankle inversion    Ankle eversion     (Blank rows = not tested)  LOWER EXTREMITY MMT:  MMT Right eval Left eval  Hip flexion    Hip extension    Hip abduction    Hip adduction    Hip internal rotation    Hip external rotation    Knee flexion    Knee extension    Ankle dorsiflexion    Ankle plantarflexion    Ankle inversion    Ankle eversion     (Blank rows = not tested)   FUNCTIONAL TESTS:  TUG: 14 seconds 5x STS 20 seconds  Balance: SLS <5 seconds b/l; feet together with EO/EC: increased trunk sway with EC; feet together EO/EC on foam: increased trunk sway EO and has to hold on with EC for stability   GAIT: Distance walked: Pt amb without any AD, decreased gait speed, decreased stride length, decreased trunk rotation noted                                                                                                                               TREATMENT DATE: 06/03/24   Initial evaluation HEP instruction PATIENT EDUCATION:  Education details: PT POC/goals, purpose of PT, HEP instruction Person educated: Patient Education method: Explanation Education comprehension: verbalized understanding  HOME EXERCISE PROGRAM: 5x STS 2 sets  ASSESSMENT:  CLINICAL IMPRESSION: Patient is a 78 y.o. F who  was seen today for physical therapy evaluation and treatment for abnormal balance/weakness.   OBJECTIVE IMPAIRMENTS: Abnormal gait, decreased activity  tolerance, decreased balance, decreased mobility, decreased strength, and pain.   ACTIVITY LIMITATIONS: lifting, bending, squatting, and locomotion level  PARTICIPATION LIMITATIONS: cleaning, laundry, and community activity  PERSONAL FACTORS: Age, Time since onset of injury/illness/exacerbation, and 1-2 comorbidities: see PMH (peripheral neuropathy/DM) are also affecting patient's functional outcome.   REHAB POTENTIAL: Good  CLINICAL DECISION MAKING: Evolving/moderate complexity  EVALUATION COMPLEXITY: Moderate   GOALS: Goals reviewed with patient? Yes  SHORT TERM GOALS: Target date: 07/03/24 Pt will be instructed on HEP for balance/LE strengthening which she can perform with visual cues (handouts) 5x/week  Baseline: to be initiated at next session Goal status: INITIAL    LONG TERM GOALS: Target date: 07/29/24  Improve ABC >10 points indicating pt significantly notes reduced fear of falling Baseline: 45 Goal status: INITIAL  2.  Improve 5x STS to <15 seconds indicating reduced risk for falls and improved LE strength Baseline: 20 sec Goal status: INITIAL  3.  Improve TUG <14 seconds indicating pt at decreased risk for falls Baseline: 14 sec Goal status: INITIAL     PLAN:  PT FREQUENCY: 2x/week  PT DURATION: 8 weeks  PLANNED INTERVENTIONS: 97110-Therapeutic exercises, 97530- Therapeutic activity, [id number]- Neuromuscular re-education, 97535- Self Care, [id number]- Manual therapy, and Patient/Family education  PLAN FOR NEXT SESSION: balance exercises Vernell Reges, PT, DPT, OCS   Vernell FORBES Reges, PT 06/03/2024, 4:28 PM

## 2024-06-08 ENCOUNTER — Ambulatory Visit

## 2024-06-08 DIAGNOSIS — R296 Repeated falls: Secondary | ICD-10-CM

## 2024-06-08 DIAGNOSIS — R2689 Other abnormalities of gait and mobility: Secondary | ICD-10-CM

## 2024-06-08 NOTE — Therapy (Signed)
 OUTPATIENT PHYSICAL THERAPY LOWER EXTREMITY TREATMENT   Patient Name: NOHELANI BENNING MRN: [id number] DOB:[birth year], 78 y.o., female Today's Date: 06/08/2024  END OF SESSION:  PT End of Session - 06/08/24 1552     Visit Number 2    Number of Visits 17    Date for Recertification  07/29/24    Authorization Type PN at visit #10; 2x/week 8 weeks POC    PT Start Time 1545    PT Stop Time 1630    PT Time Calculation (min) 45 min    Equipment Utilized During Treatment Gait belt    Activity Tolerance Patient tolerated treatment well    Behavior During Therapy WFL for tasks assessed/performed          Past Medical History:  Diagnosis Date   Abnormal EKG    Arthritis    osteo, multiple sites   Bipolar 1 disorder, mixed (HCC)    Cancer (HCC)    skin-malignant neoplasm,scc   Cataract cortical, senile    Chronic kidney disease    Cyst of ovary    Diverticulosis 2010   Dysrhythmia    left ventricular systolic dysfunction   HOH (hard of hearing)    right ear   Hyperlipidemia    Hypertension    Lordosis of lumbar region    kyphosis   Other urinary incontinence    Peripheral neuropathy    balls of feet   RBBB (right bundle branch block)    Dr Hester LAMY [phone]   Tremor    UTI (urinary tract infection)    June at [facility] ER   Valvular heart disease    Vertigo    dental office chairs   Past Surgical History:  Procedure Laterality Date   BREAST BIOPSY Right years ago   2 bx. done. Negative results, benign lumpectomy   BREAST EXCISIONAL BIOPSY Right    CATARACT EXTRACTION Bilateral    COLONOSCOPY  2010,2013,2019   diverticulosis   COLONOSCOPY WITH PROPOFOL  N/A 09/26/2017   Procedure: COLONOSCOPY WITH PROPOFOL ;  Surgeon: Gaylyn Gladis PENNER, MD;  Location: [facility] ENDOSCOPY;  Service: Endoscopy;  Laterality: N/A;   COLONOSCOPY WITH PROPOFOL  N/A 03/20/2021   Procedure: COLONOSCOPY WITH PROPOFOL ;  Surgeon: Maryruth Ole DASEN, MD;  Location: [facility] ENDOSCOPY;  Service:  Endoscopy;  Laterality: N/A;   DIAGNOSTIC LAPAROSCOPY     EYE SURGERY Bilateral 2008,2013   cataract, right then left   FOOT SURGERY Right 2014   bunionectomy with hammertoe   FOREIGN BODY REMOVAL Left 12/04/2018   Procedure: F/B REMOVAL, COMPLICATED;  Surgeon: Lilli Cough, DPM;  Location: [facility] SURGERY CNTR;  Service: Podiatry;  Laterality: Left;   removed hammerlock implant and screw x 1   HAMMER TOE SURGERY Left 04/27/2015   Procedure: HAMMER TOE CORRECTION 2ND TOE;  Surgeon: Eva Gay, DPM;  Location: [facility] SURGERY CNTR;  Service: Podiatry;  Laterality: Left;   LAPAROSCOPIC BILATERAL SALPINGO OOPHERECTOMY Bilateral 02/08/2020   Procedure: LAPAROSCOPIC BILATERAL SALPINGO OOPHORECTOMY, ABDOMINAL AHESIONS LYSIS;  Surgeon: Schermerhorn, Debby PARAS, MD;  Location: [facility] ORS;  Service: Gynecology;  Laterality: Bilateral;   METATARSAL OSTEOTOMY Left 04/27/2015   Procedure: METATARSAL OSTEOTOMY 1ST METATARSAL AND Proximal phalanx osteotomy.;  Surgeon: Eva Gay, DPM;  Location: [facility] SURGERY CNTR;  Service: Podiatry;  Laterality: Left;   MOHS SURGERY  07/10/2018   face and right forearm   RECONSTRUCTION OF ANGULAR DEFORMITY,TOE Left 12/04/2018   Procedure: RECONSTRUCTION OVERLAPPING TOE LEFT;  Surgeon: Lilli Cough, DPM;  Location: [facility] SURGERY CNTR;  Service: Podiatry;  Laterality: Left;   TUBAL LIGATION     Patient Active Problem List   Diagnosis Date Noted   Real time reverse transcriptase PCR positive for severe acute respiratory syndrome coronavirus 2 (SARS-CoV-2) 01/23/2021   UTI (urinary tract infection) 01/23/2021   Bipolar 1 disorder, manic, full remission 03/30/2020   Cyst of ovary 03/30/2020   Mixed hyperlipidemia 03/30/2020   RBBB 03/30/2020   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) 03/30/2020   Falls 01/05/2020   Type 2 diabetes mellitus (HCC) 01/05/2020   Tremor of both hands 09/16/2019   Multinodular goiter 10/24/2018   Atrophic kidney,  acquired 05/02/2017   Incomplete emptying of bladder 05/02/2017   Overactive detrusor 05/02/2017   Benign essential HTN 10/23/2016   Chronic kidney disease, stage 3 unspecified (HCC) 04/10/2016   LVH (left ventricular hypertrophy) due to hypertensive disease, without heart failure 04/10/2016   Idiopathic peripheral neuropathy 06/01/2015   Arthritis involving multiple sites 03/24/2014   Depressed bipolar I disorder (HCC) 03/24/2014   Personal history of other malignant neoplasm of skin 12/22/2013    PCP: Dr. Jeffie, MD  REFERRING PROVIDER: [facility]  REFERRING DIAG:  Diagnosis  R25.1 (ICD-10-CM) - Tremor  R26.89 (ICD-10-CM) - Imbalance  R53.1 (ICD-10-CM) - Weakness    THERAPY DIAG:  Repeated falls  Balance problem  Rationale for Evaluation and Treatment: Rehabilitation  ONSET DATE: chronic  SUBJECTIVE:   SUBJECTIVE STATEMENT: Pt states she feels like she lost some of her muscle strength this year; she was hospitalized (2 weeks including inpatient rehab) for influenza last December and she feels like she has been weak since then.    A few weeks ago she was mopping up some water from her tub while squat/bending over and she went down on her knees and couldn't get up- her life alert went off and her son was called and he came and helped her up.  She goes 1x/week for a chair exercise class weekly in [place]   PERTINENT HISTORY: Per chart review from neurology 05/06/24: She takes gabapentin  300 mg nightly for her essential tremor. She previously tried primidone but experienced a severe reaction, including inability to walk, after taking 25 mg, leading to discontinuation of the medication. Her psychiatrist assisted in disposing of the remaining primidone tablets.   Her tremor is longstanding, affecting her left hand, which impacts her ability to perform tasks such as pouring liquids. Her father also had similar tremor issues, indicating a familial component.   She  experiences balance issues and frequent falls. She has been participating in chair exercises at the [facility] for the past four weeks to improve her balance and strength. She describes a specific incident where she fell on her knees while at home, which triggered her Life Protect device to alert her son for assistance.   She has a history of neuropathy and was hospitalized last December for influenza and a urinary tract infection, which resulted in a loss of strength in her arms and legs. She uses her arms to assist in standing from seated positions, especially in settings without armrests.   Her current medication regimen includes gabapentin  300 mg at night.   PAIN:  Are you having pain? Not currently  PRECAUTIONS: Fall  RED FLAGS: None   WEIGHT BEARING RESTRICTIONS: No  FALLS:  Has patient fallen in last 6 months? Yes. 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  DIAGNOSTIC FINDINGS:  Per chart review: 08/12/2023  EMG: Impression: Abnormal study.  There is electrodiagnostic evidence of a chronic, severe sensorimotor polyneuropathy in the lower extremities.    06/02/2022 CT HEAD WO/ CT CERVICAL SPINE WO --No evidence of acute intracranial pathology.   --No fracture or traumatic listhesis of the cervical spine.     PATIENT SURVEYS:  ABC: 45%  COGNITION: Overall cognitive status: Within functional limits for tasks assessed     SENSATION: WFL  POSTURE: rounded shoulders  PALPATION: Deferred today  LOWER EXTREMITY ROM:  Active ROM Right eval Left eval  Hip flexion    Hip extension    Hip abduction    Hip adduction    Hip internal rotation    Hip external rotation    Knee flexion    Knee extension    Ankle dorsiflexion    Ankle plantarflexion    Ankle inversion    Ankle eversion     (Blank rows = not tested)  LOWER EXTREMITY MMT:  MMT Right eval Left eval  Hip flexion    Hip extension    Hip abduction    Hip adduction    Hip internal rotation    Hip external rotation    Knee flexion    Knee extension    Ankle dorsiflexion    Ankle plantarflexion    Ankle inversion    Ankle eversion     (Blank rows = not tested)   FUNCTIONAL TESTS:  TUG: 14 seconds 5x STS 20 seconds  Balance: SLS <5 seconds b/l; feet together with EO/EC: increased trunk sway with EC; feet together EO/EC on foam: increased trunk sway EO and has to hold on with EC for stability   GAIT: Distance walked: Pt amb without any AD, decreased gait speed, decreased stride length, decreased trunk rotation noted                                                                                                                               TREATMENT DATE: 06/10/24  Subjective: pt reports no falls or trips/stumbles since her initial evaluation appointment.  She worked on the squats  Objective: Pain: 7/10 knees Normal coordination for finger to nose/PT finger R and L, alternating toe taps, heel to shin R and L   Therapeutic Exercise: Nustep:  level 2 x7 minutes Heel slide for knee AROM x10 ea  Therapeutic Activities Sit to stand x 5, 3 sets Front step ups 6 inch step: x10 leading with R, x10 leading with L Amb in hallway with PT- verbal cues  to stand tall- 3 laps   Neuro re-ed: PT (close supervision with gait belt) Tandem stance: 3x ea direction, x 10 seconds SLS x 3 attempts each, pt fearful of trying without UE support Feet together on airex: 3x 30 seconds  HEP instruction- continue with sit to stand, 10 reps, 2x/day  PATIENT EDUCATION:  Education details: PT POC/goals, purpose of PT, HEP instruction Person educated: Patient Education method: Explanation Education comprehension: verbalized understanding  HOME EXERCISE PROGRAM: 5x STS 2 sets, 2x/day  ASSESSMENT:  CLINICAL IMPRESSION: Patient is a 78 y.o. F who was seen today for physical therapy treatment for abnormal balance/weakness.  She reports 7/10 pain level in knees b/l related to her knee OA; she is considering c/u with orthopedics.  Even with report of high pain she remains  pleasant/smiling/laughing throughout session.  A bit uncoordinated and impulsive during balance exercises.  PT stayed close for safety during standing exercises.  Planning to further assess dynamic balance at next session.    OBJECTIVE IMPAIRMENTS: Abnormal gait, decreased activity tolerance, decreased balance, decreased mobility, decreased strength, and pain.   ACTIVITY LIMITATIONS: lifting, bending, squatting, and locomotion level  PARTICIPATION LIMITATIONS: cleaning, laundry, and community activity  PERSONAL FACTORS: Age, Time since onset of injury/illness/exacerbation, and 1-2 comorbidities: see PMH (peripheral neuropathy/DM) are also affecting patient's functional outcome.   REHAB POTENTIAL: Good  CLINICAL DECISION MAKING: Evolving/moderate complexity  EVALUATION COMPLEXITY: Moderate   GOALS: Goals reviewed with patient? Yes  SHORT TERM GOALS: Target date: 07/03/24 Pt will be instructed on HEP for balance/LE strengthening which she can perform with visual cues (handouts) 5x/week  Baseline: to be initiated at next session Goal status: INITIAL    LONG TERM GOALS: Target  date: 07/29/24  Improve ABC >10 points indicating pt significantly notes reduced fear of falling Baseline: 45 Goal status: INITIAL  2.  Improve 5x STS to <15 seconds indicating reduced risk for falls and improved LE strength Baseline: 20 sec Goal status: INITIAL  3.  Improve TUG <14 seconds indicating pt at decreased risk for falls Baseline: 14 sec Goal status: INITIAL     PLAN:  PT FREQUENCY: 2x/week  PT DURATION: 8 weeks  PLANNED INTERVENTIONS: 97110-Therapeutic exercises, 97530- Therapeutic activity, 97112- Neuromuscular re-education, 97535- Self Care, [id number]- Manual therapy, and Patient/Family education  PLAN FOR NEXT SESSION: balance exercises, may benefit from FGA or DGI at next visit to assess more dynamic balance  Vernell Reges, PT, DPT, OCS   Vernell FORBES Reges, PT 06/08/2024, 3:52 PM

## 2024-06-16 ENCOUNTER — Ambulatory Visit: Admitting: Physical Therapy

## 2024-06-16 DIAGNOSIS — R296 Repeated falls: Secondary | ICD-10-CM | POA: Diagnosis not present

## 2024-06-16 DIAGNOSIS — R2689 Other abnormalities of gait and mobility: Secondary | ICD-10-CM

## 2024-06-16 NOTE — Therapy (Unsigned)
 OUTPATIENT PHYSICAL THERAPY LOWER EXTREMITY/BALANCE TREATMENT   Patient Name: Tracey Wilson MRN: [id number] DOB:February 08, 1946, 78 y.o., female Today's Date: 06/16/2024  END OF SESSION:  PT End of Session - 06/16/24 1600     Visit Number 3    Number of Visits 17    Date for Recertification  07/29/24    Authorization Type PN at visit #10; 2x/week 8 weeks POC    PT Start Time 1600    PT Stop Time 1648    PT Time Calculation (min) 48 min    Equipment Utilized During Treatment Gait belt    Activity Tolerance Patient tolerated treatment well    Behavior During Therapy WFL for tasks assessed/performed           Past Medical History:  Diagnosis Date   Abnormal EKG    Arthritis    osteo, multiple sites   Bipolar 1 disorder, mixed (HCC)    Cancer (HCC)    skin-malignant neoplasm,scc   Cataract cortical, senile    Chronic kidney disease    Cyst of ovary    Diverticulosis 2010   Dysrhythmia    left ventricular systolic dysfunction   HOH (hard of hearing)    right ear   Hyperlipidemia    Hypertension    Lordosis of lumbar region    kyphosis   Other urinary incontinence    Peripheral neuropathy    balls of feet   RBBB (right bundle branch block)    Dr Hester LAMY [phone]   Tremor    UTI (urinary tract infection)    June at [facility]   Valvular heart disease    Vertigo    dental office chairs   Past Surgical History:  Procedure Laterality Date   BREAST BIOPSY Right years ago   2 bx. done. Negative results, benign lumpectomy   BREAST EXCISIONAL BIOPSY Right    CATARACT EXTRACTION Bilateral    COLONOSCOPY  2010,2013,2019   diverticulosis   COLONOSCOPY WITH PROPOFOL  N/A 09/26/2017   Procedure: COLONOSCOPY WITH PROPOFOL ;  Surgeon: Gaylyn Gladis PENNER, MD;  Location: [facility] ENDOSCOPY;  Service: Endoscopy;  Laterality: N/A;   COLONOSCOPY WITH PROPOFOL  N/A 03/20/2021   Procedure: COLONOSCOPY WITH PROPOFOL ;  Surgeon: Maryruth Ole DASEN, MD;  Location: [facility] ENDOSCOPY;   Service: Endoscopy;  Laterality: N/A;   DIAGNOSTIC LAPAROSCOPY     EYE SURGERY Bilateral 2008,2013   cataract, right then left   FOOT SURGERY Right 2014   bunionectomy with hammertoe   FOREIGN BODY REMOVAL Left 12/04/2018   Procedure: F/B REMOVAL, COMPLICATED;  Surgeon: Lilli Cough, DPM;  Location: [facility];  Service: Podiatry;  Laterality: Left;   removed hammerlock implant and screw x 1   HAMMER TOE SURGERY Left 04/27/2015   Procedure: HAMMER TOE CORRECTION 2ND TOE;  Surgeon: Eva Gay, DPM;  Location: [facility] SURGERY [facility];  Service: Podiatry;  Laterality: Left;   LAPAROSCOPIC BILATERAL SALPINGO OOPHERECTOMY Bilateral 02/08/2020   Procedure: LAPAROSCOPIC BILATERAL SALPINGO OOPHORECTOMY, ABDOMINAL AHESIONS LYSIS;  Surgeon: Schermerhorn, Debby PARAS, MD;  Location: [facility] ORS;  Service: Gynecology;  Laterality: Bilateral;   METATARSAL OSTEOTOMY Left 04/27/2015   Procedure: METATARSAL OSTEOTOMY 1ST METATARSAL AND Proximal phalanx osteotomy.;  Surgeon: Eva Gay, DPM;  Location: [facility] SUR[facility]ERY [facility];  Service: Podiatry;  Laterality: Left;   MOHS SURGERY  07/10/2018   face and right forearm   RECONSTRUCTION OF ANGULAR DEFORMITY,TOE Left 12/04/2018   Procedure: RECONSTRUCTION OVERLAPPING TOE LEFT;  Surgeon: Lilli Cough, DPM;  Location: [facility];  Service: Podiatry;  Laterality: Left;   TUBAL LIGATION     Patient Active Problem List   Diagnosis Date Noted   Real time reverse transcriptase PCR positive for severe acute respiratory syndrome coronavirus 2 (SARS-CoV-2) 01/23/2021   UTI (urinary tract infection) 01/23/2021   Bipolar 1 disorder, manic, full remission 03/30/2020   Cyst of ovary 03/30/2020   Mixed hyperlipidemia 03/30/2020   RBBB 03/30/2020   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) 03/30/2020   Falls 01/05/2020   Type 2 diabetes mellitus (HCC) 01/05/2020   Tremor of both hands 09/16/2019   Multinodular goiter 10/24/2018   Atrophic  kidney, acquired 05/02/2017   Incomplete emptying of bladder 05/02/2017   Overactive detrusor 05/02/2017   Benign essential HTN 10/23/2016   Chronic kidney disease, stage 3 unspecified (HCC) 04/10/2016   LVH (left ventricular hypertrophy) due to hypertensive disease, without heart failure 04/10/2016   Idiopathic peripheral neuropathy 06/01/2015   Arthritis involving multiple sites 03/24/2014   Depressed bipolar I disorder (HCC) 03/24/2014   Personal history of other malignant neoplasm of skin 12/22/2013    PCP: Dr. Jeffie, MD  REFERRING PROVIDER: Lauraine Rocks [facility]  REFERRING DIAG:  Diagnosis  R25.1 (ICD-10-CM) - Tremor  R26.89 (ICD-10-CM) - Imbalance  R53.1 (ICD-10-CM) - Weakness    THERAPY DIAG:  Repeated falls  Balance problem  Rationale for Evaluation and Treatment: Rehabilitation  ONSET DATE: chronic  SUBJECTIVE:   SUBJECTIVE STATEMENT: Pt states she feels like she lost some of her muscle strength this year; she was hospitalized (2 weeks including inpatient rehab) for influenza last December and she feels like she has been weak since then.    A few weeks ago she was mopping up some water from her tub while squat/bending over and she went down on her knees and couldn't get up- her life alert went off and her son was called and he came and helped her up.  She goes 1x/week for a chair exercise class weekly in [place]   PERTINENT HISTORY: Per chart review from neurology 05/06/24: She takes gabapentin  300 mg nightly for her essential tremor. She previously tried primidone but experienced a severe reaction, including inability to walk, after taking 25 mg, leading to discontinuation of the medication. Her psychiatrist assisted in disposing of the remaining primidone tablets.   Her tremor is longstanding, affecting her left hand, which impacts her ability to perform tasks such as pouring liquids. Her father also had similar tremor issues, indicating a familial component.    She experiences balance issues and frequent falls. She has been participating in chair exercises at the [facility] and [facility] for the past four weeks to improve her balance and strength. She describes a specific incident where she fell on her knees while at home, which triggered her Life Protect device to alert her son for assistance.   She has a history of neuropathy and was hospitalized last December for influenza and a urinary tract infection, which resulted in a loss of strength in her arms and legs. She uses her arms to assist in standing from seated positions, especially in settings without armrests.   Her current medication regimen includes gabapentin  300 mg at night.   PAIN:  Are you having pain? Not currently  PRECAUTIONS: Fall  RED FLAGS: None   WEIGHT BEARING RESTRICTIONS: No  FALLS:  Has patient fallen in last 6 months? Yes. 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  DIAGNOSTIC FINDINGS:  Per chart review: 08/12/2023  EMG: Impression: Abnormal study.  There is electrodiagnostic evidence of a chronic, severe sensorimotor polyneuropathy in the lower extremities.    06/02/2022 CT HEAD WO/ CT CERVICAL SPINE WO --No evidence of acute intracranial  pathology.  --No fracture or traumatic listhesis of the cervical spine.     PATIENT SURVEYS:  ABC: 45%  COGNITION: Overall cognitive status: Within functional limits for tasks assessed     SENSATION: WFL  POSTURE: rounded shoulders  PALPATION: Deferred today  LOWER EXTREMITY ROM:  Active ROM Right eval Left eval  Hip flexion    Hip extension    Hip abduction    Hip adduction    Hip internal rotation    Hip external rotation    Knee flexion    Knee extension    Ankle dorsiflexion    Ankle plantarflexion    Ankle inversion    Ankle eversion     (Blank rows = not tested)  LOWER EXTREMITY MMT:  MMT Right eval Left eval  Hip flexion    Hip extension    Hip abduction    Hip adduction    Hip internal rotation    Hip external rotation    Knee flexion    Knee extension    Ankle dorsiflexion    Ankle plantarflexion    Ankle inversion    Ankle eversion     (Blank rows = not tested)   FUNCTIONAL TESTS:  TUG: 14 seconds 5x STS 20 seconds  Balance: SLS <5 seconds b/l; feet together with EO/EC: increased trunk sway with EC; feet together EO/EC on foam: increased trunk sway EO and has to hold on with EC for stability   GAIT: Distance walked: Pt amb without any AD, decreased gait speed, decreased stride length, decreased trunk rotation noted  COORDINATION Normal coordination for finger to nose/PT finger R and L, alternating toe taps, heel to shin R and L                                                                                                                                TREATMENT DATE: 06/15/2024  Subjective: Patient reports dealing with significant knee pain and considering cortisone injection for this. Patient reports no recent falls/near-falls. She has not yet added  to HEP beyond sit to stands previously discussed.   Objective: Pain: 7/10 knees   Therapeutic Exercise: Nustep: level 3-4 x7 minutes   -lowered resistance from 4 to 3 and 3:30  min  PATIENT EDUCATION: Discussed plan for progression of HEP next visit and reviewed current PT POC.   *not today* Heel slide for knee AROM x10 ea   Therapeutic Activities    *next visit* Sit to stand x 5, 3 sets Front step ups 6 inch step: x10 leading with R, x10 leading with L  *not today* Amb in hallway with PT- verbal cues to stand tall- 3 laps   Neuro re-ed: PT (close supervision with gait belt)  DGI performed (12/24 score)  -see flowsheet  Dynamic march in // bars; 4x D/B  Tandem stance:multiple attempts with 7-8 sec holds obtained Semitandem stance; x 30 sec each side    *next visit*   Semitandem on Airex    *not today*   Feet together on airex: 3x 30 seconds   SLS x 3 attempts each, pt fearful of trying without UE support   PATIENT EDUCATION:  Education details: PT POC/goals, purpose of PT, HEP instruction Person educated: Patient Education method: Explanation Education comprehension: verbalized understanding  HOME EXERCISE PROGRAM: 5x STS 2 sets, 2x/day  ASSESSMENT:  CLINICAL IMPRESSION: Patient is a 78 y.o. F who was seen today for physical therapy treatment for abnormal balance/weakness. DGI was completed per evaluating therapist's plan to further assess dynamic balance. Patient has intermittent lateral staggering and several impairments noted with DGI including gait instability with head turns and stepping over obstacles. Pt as notably impaired postural control and gait stability associated with peripheral neuropathy and weakness. We will further assess specific strength testing next visit and update HEP for carryover of PT intervention at home. Pt will continue to benefit from skilled PT services to address deficits and improve function.   OBJECTIVE IMPAIRMENTS: Abnormal gait, decreased activity tolerance, decreased balance, decreased mobility, decreased strength, and pain.   ACTIVITY LIMITATIONS: lifting, bending, squatting, and locomotion  level  PARTICIPATION LIMITATIONS: cleaning, laundry, and community activity  PERSONAL FACTORS: Age, Time since onset of injury/illness/exacerbation, and 1-2 comorbidities: see PMH (peripheral neuropathy/DM) are also affecting patient's functional outcome.   REHAB POTENTIAL: Good  CLINICAL DECISION MAKING: Evolving/moderate complexity  EVALUATION COMPLEXITY: Moderate   GOALS: Goals reviewed with patient? Yes  SHORT TERM GOALS: Target date: 07/03/24 Pt will be instructed on HEP for balance/LE strengthening which she can perform with visual cues (handouts) 5x/week  Baseline: to be initiated at next session Goal status: INITIAL    LONG TERM GOALS: Target date: 07/29/24  Improve ABC >10 points indicating pt significantly notes reduced fear of falling Baseline: 45 Goal status: INITIAL  2.  Improve 5x STS to <15 seconds indicating reduced risk for falls and improved LE strength Baseline: 20 sec Goal status: INITIAL  3.  Improve TUG <14 seconds indicating pt at decreased risk for falls Baseline: 14 sec Goal status: INITIAL  4.  Improve DGI score by 4 points or greater indicating pt at decreased risk for falls Baseline: 12/24 Goal status: INITIAL     PLAN:  PT FREQUENCY: 2x/week  PT DURATION: 8 weeks  PLANNED INTERVENTIONS: 97110-Therapeutic exercises, 97530- Therapeutic activity, 97112- Neuromuscular re-education, 97535- Self Care, [id number]- Manual therapy, and Patient/Family education  PLAN FOR NEXT SESSION: balance exercises, dynamic stepping tasks and visual scanning c gait drills, train for safe weight shifting and obstacle negotiation with successive visits   Venetia Endo,  PT, DPT #[id number]  Venetia ONEIDA Endo, PT 06/17/2024, 2:35 PM

## 2024-06-17 ENCOUNTER — Encounter: Payer: Self-pay | Admitting: Physical Therapy

## 2024-06-18 ENCOUNTER — Ambulatory Visit: Admitting: Physical Therapy

## 2024-06-18 ENCOUNTER — Encounter: Payer: Self-pay | Admitting: Physical Therapy

## 2024-06-18 DIAGNOSIS — R296 Repeated falls: Secondary | ICD-10-CM

## 2024-06-18 DIAGNOSIS — R2689 Other abnormalities of gait and mobility: Secondary | ICD-10-CM

## 2024-06-18 NOTE — Therapy (Unsigned)
 OUTPATIENT PHYSICAL THERAPY LOWER EXTREMITY/BALANCE TREATMENT   Patient Name: Tracey Wilson MRN: [id number] DOB:[birth year], 78 y.o., female Today's Date: 06/18/2024  END OF SESSION:  PT End of Session - 06/18/24 1522     Visit Number 4    Number of Visits 17    Date for Recertification  07/29/24    Authorization Type PN at visit #10; 2x/week 8 weeks POC    PT Start Time 1555    PT Stop Time 1636    PT Time Calculation (min) 41 min    Equipment Utilized During Treatment Gait belt    Activity Tolerance Patient tolerated treatment well    Behavior During Therapy WFL for tasks assessed/performed            Past Medical History:  Diagnosis Date   Abnormal EKG    Arthritis    osteo, multiple sites   Bipolar 1 disorder, mixed (HCC)    Cancer (HCC)    skin-malignant neoplasm,scc   Cataract cortical, senile    Chronic kidney disease    Cyst of ovary    Diverticulosis 2010   Dysrhythmia    left ventricular systolic dysfunction   HOH (hard of hearing)    right ear   Hyperlipidemia    Hypertension    Lordosis of lumbar region    kyphosis   Other urinary incontinence    Peripheral neuropathy    balls of feet   RBBB (right bundle branch block)    Dr Hester LAMY [id number]   Tremor    UTI (urinary tract infection)    June at [facility] ER   Valvular heart disease    Vertigo    dental office chairs   Past Surgical History:  Procedure Laterality Date   BREAST BIOPSY Right years ago   2 bx. done. Negative results, benign lumpectomy   BREAST EXCISIONAL BIOPSY Right    CATARACT EXTRACTION Bilateral    COLONOSCOPY  2010,2013,2019   diverticulosis   COLONOSCOPY WITH PROPOFOL  N/A 09/26/2017   Procedure: COLONOSCOPY WITH PROPOFOL ;  Surgeon: Gaylyn Gladis PENNER, MD;  Location: [facility] ENDOSCOPY;  Service: Endoscopy;  Laterality: N/A;   COLONOSCOPY WITH PROPOFOL  N/A 03/20/2021   Procedure: COLONOSCOPY WITH PROPOFOL ;  Surgeon: Maryruth Ole DASEN, MD;  Location: [facility] ENDOSCOPY;   Service: Endoscopy;  Laterality: N/A;   DIAGNOSTIC LAPAROSCOPY     EYE SURGERY Bilateral 2008,2013   cataract, right then left   FOOT SURGERY Right 2014   bunionectomy with hammertoe   FOREIGN BODY REMOVAL Left 12/04/2018   Procedure: F/B REMOVAL, COMPLICATED;  Surgeon: Lilli Cough, DPM;  Location: [facility];  Service: Podiatry;  Laterality: Left;   removed hammerlock implant and screw x 1   HAMMER TOE SURGERY Left 04/27/2015   Procedure: HAMMER TOE CORRECTION 2ND TOE;  Surgeon: Eva Gay, DPM;  Location: [facility];  Service: Podiatry;  Laterality: Left;   LAPAROSCOPIC BILATERAL SALPINGO OOPHERECTOMY Bilateral 02/08/2020   Procedure: LAPAROSCOPIC BILATERAL SALPINGO OOPHORECTOMY, ABDOMINAL AHESIONS LYSIS;  Surgeon: Schermerhorn, Debby PARAS, MD;  Location: [facility] ORS;  Service: Gynecology;  Laterality: Bilateral;   METATARSAL OSTEOTOMY Left 04/27/2015   Procedure: METATARSAL OSTEOTOMY 1ST METATARSAL AND Proximal phalanx osteotomy.;  Surgeon: Eva Gay, DPM;  Location: [facility];  Service: Podiatry;  Laterality: Left;   MOHS SURGERY  07/10/2018   face and right forearm   RECONSTRUCTION OF ANGULAR DEFORMITY,TOE Left 12/04/2018   Procedure: RECONSTRUCTION OVERLAPPING TOE LEFT;  Surgeon: Lilli Cough, DPM;  Location: [facility];  Service:  Podiatry;  Laterality: Left;   TUBAL LIGATION     Patient Active Problem List   Diagnosis Date Noted   Real time reverse transcriptase PCR positive for severe acute respiratory syndrome coronavirus 2 (SARS-CoV-2) 01/23/2021   UTI (urinary tract infection) 01/23/2021   Bipolar 1 disorder, manic, full remission 03/30/2020   Cyst of ovary 03/30/2020   Mixed hyperlipidemia 03/30/2020   RBBB 03/30/2020   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) 03/30/2020   Falls 01/05/2020   Type 2 diabetes mellitus (HCC) 01/05/2020   Tremor of both hands 09/16/2019   Multinodular goiter 10/24/2018   Atrophic  kidney, acquired 05/02/2017   Incomplete emptying of bladder 05/02/2017   Overactive detrusor 05/02/2017   Benign essential HTN 10/23/2016   Chronic kidney disease, stage 3 unspecified (HCC) 04/10/2016   LVH (left ventricular hypertrophy) due to hypertensive disease, without heart failure 04/10/2016   Idiopathic peripheral neuropathy 06/01/2015   Arthritis involving multiple sites 03/24/2014   Depressed bipolar I disorder (HCC) 03/24/2014   Personal history of other malignant neoplasm of skin 12/22/2013    PCP: Dr. Jeffie, MD  REFERRING PROVIDER: Lauraine Rocks [facility]  REFERRING DIAG:  Diagnosis  R25.1 (ICD-10-CM) - Tremor  R26.89 (ICD-10-CM) - Imbalance  R53.1 (ICD-10-CM) - Weakness    THERAPY DIAG:  Repeated falls  Balance problem  Rationale for Evaluation and Treatment: Rehabilitation  ONSET DATE: chronic  SUBJECTIVE:   SUBJECTIVE STATEMENT: Pt states she feels like she lost some of her muscle strength this year; she was hospitalized (2 weeks including inpatient rehab) for influenza last December and she feels like she has been weak since then.    A few weeks ago she was mopping up some water from her tub while squat/bending over and she went down on her knees and couldn't get up- her life alert went off and her son was called and he came and helped her up.  She goes 1x/week for a chair exercise class weekly in [place]   PERTINENT HISTORY: Per chart review from neurology 05/06/24: She takes gabapentin  300 mg nightly for her essential tremor. She previously tried primidone but experienced a severe reaction, including inability to walk, after taking 25 mg, leading to discontinuation of the medication. Her psychiatrist assisted in disposing of the remaining primidone tablets.   Her tremor is longstanding, affecting her left hand, which impacts her ability to perform tasks such as pouring liquids. Her father also had similar tremor issues, indicating a familial component.    She experiences balance issues and frequent falls. She has been participating in chair exercises at the [facility] and [facility] for the past four weeks to improve her balance and strength. She describes a specific incident where she fell on her knees while at home, which triggered her Life Protect device to alert her son for assistance.   She has a history of neuropathy and was hospitalized last December for influenza and a urinary tract infection, which resulted in a loss of strength in her arms and legs. She uses her arms to assist in standing from seated positions, especially in settings without armrests.   Her current medication regimen includes gabapentin  300 mg at night.   PAIN:  Are you having pain? Not currently  PRECAUTIONS: Fall  RED FLAGS: None   WEIGHT BEARING RESTRICTIONS: No  FALLS:  Has patient fallen in last 6 months? Yes. 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  DIAGNOSTIC FINDINGS:  Per chart review: 08/12/2023  EMG: Impression: Abnormal study.  There is electrodiagnostic evidence of a chronic, severe sensorimotor polyneuropathy in the lower extremities.    06/02/2022 CT HEAD WO/ CT CERVICAL SPINE WO --No evidence of acute intracranial  pathology.  --No fracture or traumatic listhesis of the cervical spine.     PATIENT SURVEYS:  ABC: 45%  COGNITION: Overall cognitive status: Within functional limits for tasks assessed     SENSATION: WFL  POSTURE: rounded shoulders  PALPATION: Deferred today  LOWER EXTREMITY ROM:  Active ROM Right eval Left eval  Hip flexion    Hip extension    Hip abduction    Hip adduction    Hip internal rotation    Hip external rotation    Knee flexion    Knee extension    Ankle dorsiflexion    Ankle plantarflexion    Ankle inversion    Ankle eversion     (Blank rows = not tested)  LOWER EXTREMITY MMT:  MMT Right 06/18/24 Left 06/18/24  Hip flexion 3+ 4-  Hip extension    Hip abduction (seated) 4+ 4+  Hip adduction (seated) 5 5  Hip internal rotation    Hip external rotation    Knee flexion 4+ 5  Knee extension 4-* 4-  Ankle dorsiflexion 4 4  Ankle plantarflexion 4 (8 SL heel raise) 4 (8 SL heel raise)  Ankle inversion    Ankle eversion     (Blank rows = not tested)   FUNCTIONAL TESTS:  TUG: 14 seconds 5x STS 20 seconds  Balance: SLS <5 seconds b/l; feet together with EO/EC: increased trunk sway with EC; feet together EO/EC on foam: increased trunk sway EO and has to hold on with EC for stability   GAIT: Distance walked: Pt amb without any AD, decreased gait speed, decreased stride length, decreased trunk rotation noted  COORDINATION Normal coordination for finger to nose/PT finger R and L, alternating toe taps, heel to shin R and L                                                                                                                                TREATMENT DATE: 06/18/2024  Subjective: Patient reports soreness in her knees after exercise. Patient reports compliance  with initial home exercise given by evaluating therapist. Patient reports no near-falls or safety concerns over last 2 days.   Objective: Pain: 7/10 knees  Therapeutic  Exercise: Nustep: level 3, x7 minutes  Standing march; 1 x 10 alt R/L  -for HEP review Standing HR/TR; 1 x 10  -for HEP review Seated knee extension; 1 x 10  -for HEP review  PATIENT EDUCATION: Discussed plan for progression of HEP next visit and reviewed current PT POC.   *not today* Heel slide for knee AROM x10 ea   Therapeutic Activities    Minisquat attempted; haulted due to notable knee pain      *next visit* Sit to stand x 5, 3 sets Front step ups 6 inch step: x10 leading with R, x10 leading with L  *not today* Amb in hallway with PT- verbal cues to stand tall- 3 laps   Neuro re-ed: PT (close supervision with gait belt)  Dynamic march in // bars; 4x D/B  Tandem stance:multiple attempts with 7-8 sec holds obtained Semitandem stance; x 30 sec each side     Semitandem on Airex; x 30 sec, bilat     *not today*   Feet together on airex: 3x 30 seconds   SLS x 3 attempts each, pt fearful of trying without UE support   PATIENT EDUCATION:  Education details: PT POC/goals, purpose of PT, HEP instruction Person educated: Patient Education method: Explanation Education comprehension: verbalized understanding  HOME EXERCISE PROGRAM: Access Code: [email or web address] URL: [email or web address] Date: 06/18/2024 Prepared by: Venetia Endo  Exercises - Sit to Stand  - 2 x daily - 7 x weekly - 2-3 sets - 5 reps - Standing March with Counter Support  - 2 x daily - 7 x weekly - 2 sets - 10 reps - Heel Toe Raises with Counter Support  - 2 x daily - 7 x weekly - 2 sets - 10 reps - Seated Long Arc Quad  - 2 x daily - 7 x weekly - 2 sets - 10 reps - 5sec hold   ASSESSMENT:  CLINICAL IMPRESSION: Patient is a 78 y.o. F who was seen today for physical therapy treatment for abnormal balance/weakness. DGI was completed per evaluating therapist's plan to further assess dynamic balance. Patient has intermittent lateral staggering and several impairments noted with DGI  including gait instability with head turns and stepping over obstacles. Pt as notably impaired postural control and gait stability associated with peripheral neuropathy and weakness. We will further assess specific strength testing next visit and update HEP for carryover of PT intervention at home. Pt will continue to benefit from skilled PT services to address deficits and improve function.   OBJECTIVE IMPAIRMENTS: Abnormal gait, decreased activity tolerance, decreased balance, decreased mobility, decreased strength, and pain.   ACTIVITY LIMITATIONS: lifting, bending, squatting, and locomotion level  PARTICIPATION LIMITATIONS: cleaning, laundry, and community activity  PERSONAL FACTORS: Age, Time since onset of injury/illness/exacerbation, and 1-2 comorbidities: see PMH (peripheral neuropathy/DM) are also affecting patient's functional outcome.   REHAB POTENTIAL: Good  CLINICAL DECISION MAKING: Evolving/moderate complexity  EVALUATION COMPLEXITY: Moderate   GOALS: Goals reviewed with patient? Yes  SHORT TERM GOALS: Target date: 07/03/24 Pt will be instructed on HEP for balance/LE strengthening which she can perform with visual cues (handouts) 5x/week  Baseline: to be initiated at next session Goal status: INITIAL    LONG TERM GOALS: Target date: 07/29/24  Improve ABC >10 points indicating pt significantly notes reduced fear of falling Baseline: 45 Goal status: INITIAL  2.  Improve 5x STS to <15 seconds indicating reduced risk for falls and improved LE strength Baseline: 20 sec Goal status: INITIAL  3.  Improve TUG <14 seconds indicating pt at decreased risk for falls Baseline: 14 sec Goal status: INITIAL  4.  Improve DGI score by 4 points or greater indicating pt at decreased risk for falls Baseline: 12/24 Goal status: INITIAL     PLAN:  PT FREQUENCY: 2x/week  PT DURATION: 8 weeks  PLANNED INTERVENTIONS: 97110-Therapeutic exercises, 97530- Therapeutic activity,  97112- Neuromuscular re-education, 97535- Self Care, [id number]- Manual therapy, and Patient/Family education  PLAN FOR NEXT SESSION: balance exercises, dynamic stepping tasks and visual scanning c gait drills, train for safe weight shifting and obstacle negotiation with successive visits   Venetia Endo, PT, DPT #[id number]  Venetia ONEIDA Endo, PT 06/18/2024, 4:36 PM

## 2024-06-22 ENCOUNTER — Ambulatory Visit

## 2024-06-22 DIAGNOSIS — R296 Repeated falls: Secondary | ICD-10-CM | POA: Diagnosis not present

## 2024-06-22 DIAGNOSIS — R2689 Other abnormalities of gait and mobility: Secondary | ICD-10-CM

## 2024-06-22 NOTE — Therapy (Signed)
 " OUTPATIENT PHYSICAL THERAPY LOWER EXTREMITY/BALANCE TREATMENT   Patient Name: Tracey Wilson MRN: [id number] DOB:12-21-1945, 78 y.o., female Today's Date: 06/23/2024  END OF SESSION:  PT End of Session - 06/23/24 1054     Visit Number 5    Number of Visits 17    Date for Recertification  07/29/24    Authorization Type PN at visit #10; 2x/week 8 weeks POC    PT Start Time 1500    PT Stop Time 1545    PT Time Calculation (min) 45 min    Equipment Utilized During Treatment Gait belt    Activity Tolerance Patient tolerated treatment well    Behavior During Therapy WFL for tasks assessed/performed             Past Medical History:  Diagnosis Date   Abnormal EKG    Arthritis    osteo, multiple sites   Bipolar 1 disorder, mixed (HCC)    Cancer (HCC)    skin-malignant neoplasm,scc   Cataract cortical, senile    Chronic kidney disease    Cyst of ovary    Diverticulosis 2010   Dysrhythmia    left ventricular systolic dysfunction   HOH (hard of hearing)    right ear   Hyperlipidemia    Hypertension    Lordosis of lumbar region    kyphosis   Other urinary incontinence    Peripheral neuropathy    balls of feet   RBBB (right bundle branch block)    Dr Hester LAMY [phone]   Tremor    UTI (urinary tract infection)    June at [facility]   Valvular heart disease    Vertigo    dental office chairs   Past Surgical History:  Procedure Laterality Date   BREAST BIOPSY Right years ago   2 bx. done. Negative results, benign lumpectomy   BREAST EXCISIONAL BIOPSY Right    CATARACT EXTRACTION Bilateral    COLONOSCOPY  2010,2013,2019   diverticulosis   COLONOSCOPY WITH PROPOFOL  N/A 09/26/2017   Procedure: COLONOSCOPY WITH PROPOFOL ;  Surgeon: Gaylyn Gladis PENNER, MD;  Location: [facility] ENDOSCOPY;  Service: Endoscopy;  Laterality: N/A;   COLONOSCOPY WITH PROPOFOL  N/A 03/20/2021   Procedure: COLONOSCOPY WITH PROPOFOL ;  Surgeon: Maryruth Ole DASEN, MD;  Location: [facility] ENDOSCOPY;   Service: Endoscopy;  Laterality: N/A;   DIAGNOSTIC LAPAROSCOPY     EYE SURGERY Bilateral 2008,2013   cataract, right then left   FOOT SURGERY Right 2014   bunionectomy with hammertoe   FOREIGN BODY REMOVAL Left 12/04/2018   Procedure: F/B REMOVAL, COMPLICATED;  Surgeon: Lilli Cough, DPM;  Location: [facility];  Service: Podiatry;  Laterality: Left;   removed hammerlock implant and screw x 1   HAMMER TOE SURGERY Left 04/27/2015   Procedure: HAMMER TOE CORRECTION 2ND TOE;  Surgeon: Eva Gay, DPM;  Location: [facility];  Service: Podiatry;  Laterality: Left;   LAPAROSCOPIC BILATERAL SALPINGO OOPHERECTOMY Bilateral 02/08/2020   Procedure: LAPAROSCOPIC BILATERAL SALPINGO OOPHORECTOMY, ABDOMINAL AHESIONS LYSIS;  Surgeon: Schermerhorn, Debby PARAS, MD;  Location: [facility] ORS;  Service: Gynecology;  Laterality: Bilateral;   METATARSAL OSTEOTOMY Left 04/27/2015   Procedure: METATARSAL OSTEOTOMY 1ST METATARSAL AND Proximal phalanx osteotomy.;  Surgeon: Eva Gay, DPM;  Location: [facility];  Service: Podiatry;  Laterality: Left;   MOHS SURGERY  07/10/2018   face and right forearm   RECONSTRUCTION OF ANGULAR DEFORMITY,TOE Left 12/04/2018   Procedure: RECONSTRUCTION OVERLAPPING TOE LEFT;  Surgeon: Lilli Cough, DPM;  Location: [facility];  Service: Podiatry;  Laterality: Left;   TUBAL LIGATION     Patient Active Problem List   Diagnosis Date Noted   Real time reverse transcriptase PCR positive for severe acute respiratory syndrome coronavirus 2 (SARS-CoV-2) 01/23/2021   UTI (urinary tract infection) 01/23/2021   Bipolar 1 disorder, manic, full remission 03/30/2020   Cyst of ovary 03/30/2020   Mixed hyperlipidemia 03/30/2020   RBBB 03/30/2020   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) 03/30/2020   Falls 01/05/2020   Type 2 diabetes mellitus (HCC) 01/05/2020   Tremor of both hands 09/16/2019   Multinodular goiter 10/24/2018   Atrophic  kidney, acquired 05/02/2017   Incomplete emptying of bladder 05/02/2017   Overactive detrusor 05/02/2017   Benign essential HTN 10/23/2016   Chronic kidney disease, stage 3 unspecified (HCC) 04/10/2016   LVH (left ventricular hypertrophy) due to hypertensive disease, without heart failure 04/10/2016   Idiopathic peripheral neuropathy 06/01/2015   Arthritis involving multiple sites 03/24/2014   Depressed bipolar I disorder (HCC) 03/24/2014   Personal history of other malignant neoplasm of skin 12/22/2013    PCP: Dr. Jeffie, MD  REFERRING PROVIDER: Lauraine Rocks [facility]  REFERRING DIAG:  Diagnosis  R25.1 (ICD-10-CM) - Tremor  R26.89 (ICD-10-CM) - Imbalance  R53.1 (ICD-10-CM) - Weakness    THERAPY DIAG:  Repeated falls  Balance problem  Rationale for Evaluation and Treatment: Rehabilitation  ONSET DATE: chronic  SUBJECTIVE:   SUBJECTIVE STATEMENT: Pt states she feels like she lost some of her muscle strength this year; she was hospitalized (2 weeks including inpatient rehab) for influenza last December and she feels like she has been weak since then.    A few weeks ago she was mopping up some water from her tub while squat/bending over and she went down on her knees and couldn't get up- her life alert went off and her son was called and he came and helped her up.  She goes 1x/week for a chair exercise class weekly in [place]   PERTINENT HISTORY: Per chart review from neurology 05/06/24: She takes gabapentin  300 mg nightly for her essential tremor. She previously tried primidone but experienced a severe reaction, including inability to walk, after taking 25 mg, leading to discontinuation of the medication. Her psychiatrist assisted in disposing of the remaining primidone tablets.   Her tremor is longstanding, affecting her left hand, which impacts her ability to perform tasks such as pouring liquids. Her father also had similar tremor issues, indicating a familial component.    She experiences balance issues and frequent falls. She has been participating in chair exercises at [facility] and [facility] for the past four weeks to improve her balance and strength. She describes a specific incident where she fell on her knees while at home, which triggered her Life Protect device to alert her son for assistance.   She has a history of neuropathy and was hospitalized last December for influenza and a urinary tract infection, which resulted in a loss of strength in her arms and legs. She uses her arms to assist in standing from seated positions, especially in settings without armrests.   Her current medication regimen includes gabapentin  300 mg at night.   PAIN:  Are you having pain? Not currently  PRECAUTIONS: Fall  RED FLAGS: None   WEIGHT BEARING RESTRICTIONS: No  FALLS:  Has patient fallen in last 6 months? Yes. 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  DIAGNOSTIC FINDINGS:  Per chart review: 08/12/2023  EMG: Impression: Abnormal study.  There is electrodiagnostic evidence of a chronic, severe sensorimotor polyneuropathy in the lower extremities.    06/02/2022 CT HEAD WO/ CT CERVICAL SPINE WO --No evidence of acute intracranial  pathology.  --No fracture or traumatic listhesis of the cervical spine.     PATIENT SURVEYS:  ABC: 45%  COGNITION: Overall cognitive status: Within functional limits for tasks assessed     SENSATION: WFL  POSTURE: rounded shoulders  PALPATION: Deferred today  LOWER EXTREMITY ROM:  Active ROM Right eval Left eval  Hip flexion    Hip extension    Hip abduction    Hip adduction    Hip internal rotation    Hip external rotation    Knee flexion    Knee extension    Ankle dorsiflexion    Ankle plantarflexion    Ankle inversion    Ankle eversion     (Blank rows = not tested)  LOWER EXTREMITY MMT:  MMT Right 06/18/24 Left 06/18/24  Hip flexion 3+ 4-  Hip extension    Hip abduction (seated) 4+ 4+  Hip adduction (seated) 5 5  Hip internal rotation    Hip external rotation    Knee flexion 4+ 5  Knee extension 4-* 4-  Ankle dorsiflexion 4 4  Ankle plantarflexion 4 (8 SL heel raise) 4 (8 SL heel raise)  Ankle inversion    Ankle eversion     (Blank rows = not tested)   FUNCTIONAL TESTS:  TUG: 14 seconds 5x STS 20 seconds  Balance: SLS <5 seconds b/l; feet together with EO/EC: increased trunk sway with EC; feet together EO/EC on foam: increased trunk sway EO and has to hold on with EC for stability   GAIT: Distance walked: Pt amb without any AD, decreased gait speed, decreased stride length, decreased trunk rotation noted  COORDINATION Normal coordination for finger to nose/PT finger R and L, alternating toe taps, heel to shin R and L                                                                                                                                TREATMENT DATE: 06/22/24  Subjective: Patient reports no major changes since previous visit.  She  has not fallen.  7/10 knee pain at arrival.    Objective: Pain: 7/10 knees  Therapeutic Exercise: Nustep: level 3, x7 minutes  Standing march; 1 x 10 alt R/L  -for HEP review Standing HR/TR; 1 x  10  -for HEP review Seated knee extension; 1 x 10  -for HEP review    Therapeutic Activities    Minisquat : 2x10 (tolerable today, not limited by pain)    Sit to stand x 5, 3 sets  Front step ups 6 inch step: x10 leading with R, x10 leading with L  Neuro re-ed: PT (close supervision with gait belt)  Dynamic march in // bars; 4x D/B  Semitandem airex stance; x 30 sec each side  Amb with scanning environment for obstacles: in hallway, around clinic with PT supervision/gait belt: 4 minutes  Standing alternating toe taps on 6 inch step: x 30 second intervals, 3 rounds- keeping eyes looking forward, not at feet    Hurdles: forward/lateral- deferred today   PATIENT EDUCATION:  Education details: PT POC/goals, purpose of PT, HEP instruction Person educated: Patient Education method: Explanation Education comprehension: verbalized understanding  HOME EXERCISE PROGRAM: Access Code: [email or web address] URL: [email or web address] Date: 06/18/2024 Prepared by: Venetia Endo  Exercises - Sit to Stand  - 2 x daily - 7 x weekly - 2-3 sets - 5 reps - Standing March with Counter Support  - 2 x daily - 7 x weekly - 2 sets - 10 reps - Heel Toe Raises with Counter Support  - 2 x daily - 7 x weekly - 2 sets - 10 reps - Seated Long Arc Quad  - 2 x daily - 7 x weekly - 2 sets - 10 reps - 5sec hold   ASSESSMENT:  CLINICAL IMPRESSION: Pt arrived to session today with continued report of b/l knee pain.  She was able to perform all exercises without report of increased knee pain during or at end of session.  Progressed dynamic balance/gait activities with emphasis on visual scanning vs looking down at feet.  More practice with this should help continue improve safety and reduce fall risk.  Pt will continue to benefit from skilled PT services to address deficits and improve function.   OBJECTIVE IMPAIRMENTS: Abnormal gait, decreased activity tolerance, decreased balance, decreased  mobility, decreased strength, and pain.   ACTIVITY LIMITATIONS: lifting, bending, squatting, and locomotion level  PARTICIPATION LIMITATIONS: cleaning, laundry, and community activity  PERSONAL FACTORS: Age, Time since onset of injury/illness/exacerbation, and 1-2 comorbidities: see PMH (peripheral neuropathy/DM) are also affecting patient's functional outcome.   REHAB POTENTIAL: Good  CLINICAL DECISION MAKING: Evolving/moderate complexity  EVALUATION COMPLEXITY: Moderate   GOALS: Goals reviewed with patient? Yes  SHORT TERM GOALS: Target date: 07/03/24 Pt will be instructed on HEP for balance/LE strengthening which she can perform with visual cues (handouts) 5x/week  Baseline: to be initiated at next session Goal status: INITIAL    LONG TERM GOALS: Target date: 07/29/24  Improve ABC >10 points indicating pt significantly notes reduced fear of falling Baseline: 45 Goal status: INITIAL  2.  Improve 5x STS to <15 seconds indicating reduced risk for falls and improved LE strength Baseline: 20 sec Goal status: INITIAL  3.  Improve TUG <14 seconds indicating pt at decreased risk for falls Baseline: 14 sec Goal status: INITIAL  4.  Improve DGI score by 4 points or greater indicating pt at decreased risk for falls Baseline: 12/24 Goal status: INITIAL     PLAN:  PT FREQUENCY: 2x/week  PT DURATION: 8 weeks  PLANNED INTERVENTIONS: 97110-Therapeutic exercises, 97530- Therapeutic activity, 97112- Neuromuscular re-education, 97535- Self Care, [id number]- Manual therapy, and Patient/Family education  PLAN FOR NEXT SESSION: balance exercises, dynamic stepping tasks and visual scanning c gait drills, train for safe weight shifting and obstacle negotiation with successive visits   Vernell Reges, PT, DPT, OCS  Vernell FORBES Reges, PT 06/23/2024, 10:54 AM  "

## 2024-06-29 ENCOUNTER — Ambulatory Visit

## 2024-06-29 DIAGNOSIS — R296 Repeated falls: Secondary | ICD-10-CM

## 2024-06-29 DIAGNOSIS — R2689 Other abnormalities of gait and mobility: Secondary | ICD-10-CM

## 2024-06-29 NOTE — Therapy (Signed)
 " OUTPATIENT PHYSICAL THERAPY LOWER EXTREMITY/BALANCE TREATMENT   Patient Name: Tracey Wilson MRN: [id number] DOB:[birth year], 78 y.o., female Today's Date: 06/29/2024  END OF SESSION:  PT End of Session - 06/29/24 1649     Visit Number 6    Number of Visits 17    Date for Recertification  07/29/24    Authorization Type PN at visit #10; 2x/week 8 weeks POC    PT Start Time 1515    PT Stop Time 1600    PT Time Calculation (min) 45 min    Equipment Utilized During Treatment Gait belt    Activity Tolerance Patient tolerated treatment well    Behavior During Therapy WFL for tasks assessed/performed              Past Medical History:  Diagnosis Date   Abnormal EKG    Arthritis    osteo, multiple sites   Bipolar 1 disorder, mixed (HCC)    Cancer (HCC)    skin-malignant neoplasm,scc   Cataract cortical, senile    Chronic kidney disease    Cyst of ovary    Diverticulosis 2010   Dysrhythmia    left ventricular systolic dysfunction   HOH (hard of hearing)    right ear   Hyperlipidemia    Hypertension    Lordosis of lumbar region    kyphosis   Other urinary incontinence    Peripheral neuropathy    balls of feet   RBBB (right bundle branch block)    Dr Hester LAMY [phone]   Tremor    UTI (urinary tract infection)    June at [facility]   Valvular heart disease    Vertigo    dental office chairs   Past Surgical History:  Procedure Laterality Date   BREAST BIOPSY Right years ago   2 bx. done. Negative results, benign lumpectomy   BREAST EXCISIONAL BIOPSY Right    CATARACT EXTRACTION Bilateral    COLONOSCOPY  2010,2013,2019   diverticulosis   COLONOSCOPY WITH PROPOFOL  N/A 09/26/2017   Procedure: COLONOSCOPY WITH PROPOFOL ;  Surgeon: Gaylyn Gladis PENNER, MD;  Location: [facility];  Service: Endoscopy;  Laterality: N/A;   COLONOSCOPY WITH PROPOFOL  N/A 03/20/2021   Procedure: COLONOSCOPY WITH PROPOFOL ;  Surgeon: Maryruth Ole DASEN, MD;  Location: [facility] ENDOSCOPY;   Service: Endoscopy;  Laterality: N/A;   DIAGNOSTIC LAPAROSCOPY     EYE SURGERY Bilateral 2008,2013   cataract, right then left   FOOT SURGERY Right 2014   bunionectomy with hammertoe   FOREIGN BODY REMOVAL Left 12/04/2018   Procedure: F/B REMOVAL, COMPLICATED;  Surgeon: Lilli Cough, DPM;  Location: [facility] SURGERY CNTR;  Service: Podiatry;  Laterality: Left;   removed hammerlock implant and screw x 1   HAMMER TOE SURGERY Left 04/27/2015   Procedure: HAMMER TOE CORRECTION 2ND TOE;  Surgeon: Eva Gay, DPM;  Location: [facility] SURGERY [facility];  Service: Podiatry;  Laterality: Left;   LAPAROSCOPIC BILATERAL SALPINGO OOPHERECTOMY Bilateral 02/08/2020   Procedure: LAPAROSCOPIC BILATERAL SALPINGO OOPHORECTOMY, ABDOMINAL AHESIONS LYSIS;  Surgeon: Schermerhorn, Debby PARAS, MD;  Location: [facility] ORS;  Service: Gynecology;  Laterality: Bilateral;   METATARSAL OSTEOTOMY Left 04/27/2015   Procedure: METATARSAL OSTEOTOMY 1ST METATARSAL AND Proximal phalanx osteotomy.;  Surgeon: Eva Gay, DPM;  Location: [facility] SURGERY [facility]TR;  Service: Podiatry;  Laterality: Left;   MOHS SURGERY  07/10/2018   face and right forearm   RECONSTRUCTION OF ANGULAR DEFORMITY,TOE Left 12/04/2018   Procedure: RECONSTRUCTION OVERLAPPING TOE LEFT;  Surgeon: Lilli Cough, DPM;  Location: [facility] SURG[facility]  [facility];  Service: Podiatry;  Laterality: Left;   TUBAL LIGATION     Patient Active Problem List   Diagnosis Date Noted   Real time reverse transcriptase PCR positive for severe acute respiratory syndrome coronavirus 2 (SARS-CoV-2) 01/23/2021   UTI (urinary tract infection) 01/23/2021   Bipolar 1 disorder, manic, full remission 03/30/2020   Cyst of ovary 03/30/2020   Mixed hyperlipidemia 03/30/2020   RBBB 03/30/2020   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) 03/30/2020   Falls 01/05/2020   Type 2 diabetes mellitus (HCC) 01/05/2020   Tremor of both hands 09/16/2019   Multinodular goiter 10/24/2018   Atrophic  kidney, acquired 05/02/2017   Incomplete emptying of bladder 05/02/2017   Overactive detrusor 05/02/2017   Benign essential HTN 10/23/2016   Chronic kidney disease, stage 3 unspecified (HCC) 04/10/2016   LVH (left ventricular hypertrophy) due to hypertensive disease, without heart failure 04/10/2016   Idiopathic peripheral neuropathy 06/01/2015   Arthritis involving multiple sites 03/24/2014   Depressed bipolar I disorder (HCC) 03/24/2014   Personal history of other malignant neoplasm of skin 12/22/2013    PCP: Dr. Jeffie, MD  REFERRING PROVIDER: [facility]  REFERRING DIAG:  Diagnosis  R25.1 (ICD-10-CM) - Tremor  R26.89 (ICD-10-CM) - Imbalance  R53.1 (ICD-10-CM) - Weakness    THERAPY DIAG:  Repeated falls  Balance problem  Rationale for Evaluation and Treatment: Rehabilitation  ONSET DATE: chronic  SUBJECTIVE:   SUBJECTIVE STATEMENT: Pt states she feels like she lost some of her muscle strength this year; she was hospitalized (2 weeks including inpatient rehab) for influenza last December and she feels like she has been weak since then.    A few weeks ago she was mopping up some water from her tub while squat/bending over and she went down on her knees and couldn't get up- her life alert went off and her son was called and he came and helped her up.  She goes 1x/week for a chair exercise class weekly in [place]   PERTINENT HISTORY: Per chart review from neurology 05/06/24: She takes gabapentin  300 mg nightly for her essential tremor. She previously tried primidone but experienced a severe reaction, including inability to walk, after taking 25 mg, leading to discontinuation of the medication. Her psychiatrist assisted in disposing of the remaining primidone tablets.   Her tremor is longstanding, affecting her left hand, which impacts her ability to perform tasks such as pouring liquids. Her father also had similar tremor issues, indicating a familial component.    She experiences balance issues and frequent falls. She has been participating in chair exercises at the [facility] and [facility] for the past four weeks to improve her balance and strength. She describes a specific incident where she fell on her knees while at home, which triggered her Life Protect device to alert her son for assistance.   She has a history of neuropathy and was hospitalized last December for influenza and a urinary tract infection, which resulted in a loss of strength in her arms and legs. She uses her arms to assist in standing from seated positions, especially in settings without armrests.   Her current medication regimen includes gabapentin  300 mg at night.   PAIN:  Are you having pain? Not currently  PRECAUTIONS: Fall  RED FLAGS: None   WEIGHT BEARING RESTRICTIONS: No  FALLS:  Has patient fallen in last 6 months? Yes. 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  DIAGNOSTIC FINDINGS:  Per chart review: 08/12/2023  EMG: Impression: Abnormal study.  There is electrodiagnostic evidence of a chronic, severe sensorimotor polyneuropathy in the lower extremities.    06/02/2022 CT HEAD WO/ CT CERVICAL SPINE WO --No evidence of acute intracranial  pathology.  --No fracture or traumatic listhesis of the cervical spine.     PATIENT SURVEYS:  ABC: 45%  COGNITION: Overall cognitive status: Within functional limits for tasks assessed     SENSATION: WFL  POSTURE: rounded shoulders  PALPATION: Deferred today  LOWER EXTREMITY ROM:  Active ROM Right eval Left eval  Hip flexion    Hip extension    Hip abduction    Hip adduction    Hip internal rotation    Hip external rotation    Knee flexion    Knee extension    Ankle dorsiflexion    Ankle plantarflexion    Ankle inversion    Ankle eversion     (Blank rows = not tested)  LOWER EXTREMITY MMT:  MMT Right 06/18/24 Left 06/18/24  Hip flexion 3+ 4-  Hip extension    Hip abduction (seated) 4+ 4+  Hip adduction (seated) 5 5  Hip internal rotation    Hip external rotation    Knee flexion 4+ 5  Knee extension 4-* 4-  Ankle dorsiflexion 4 4  Ankle plantarflexion 4 (8 SL heel raise) 4 (8 SL heel raise)  Ankle inversion    Ankle eversion     (Blank rows = not tested)   FUNCTIONAL TESTS:  TUG: 14 seconds 5x STS 20 seconds  Balance: SLS <5 seconds b/l; feet together with EO/EC: increased trunk sway with EC; feet together EO/EC on foam: increased trunk sway EO and has to hold on with EC for stability   GAIT: Distance walked: Pt amb without any AD, decreased gait speed, decreased stride length, decreased trunk rotation noted  COORDINATION Normal coordination for finger to nose/PT finger R and L, alternating toe taps, heel to shin R and L                                                                                                                                TREATMENT DATE: 06/22/24  Subjective: Patient reports her knees are not sore upon  arrival.  No falls/trips/stumbles since last session.  She reports she is working on her HEP.  Objective: Pain: 0/10 today  Therapeutic Exercise: Nustep: level 3, x7 minutes Standing march; 1 x 10 alt R/L  -for HEP  review Seated knee extension; 1 x 10  -for HEP review  Therapeutic Activities Mini squat : 2x10   Sit to stand x 5, 3 sets from chair with small airex Front step ups 6 inch step: x10 leading with R, x10 leading with L- not today Side stepping 4 laps  Neuro re-ed: PT (close supervision with gait belt) Dynamic march in // bars; 4x D/B SLS on airex : R and L 4x ea 5-10 second intervals Feet together on airex with UE movements: in sagital plane and in frontal plane- 3 min Amb with scanning environment for obstacles: in hallway 4 laps- scanning for letters on walls and stating out loud Standing alternating toe taps on 6 inch step: x 30 second intervals, 3 rounds- keeping eyes looking forward, not at feet Hurdles: forward: 3x ea direction, R LE and then L LE lead (6 inch height today)   PATIENT EDUCATION:  Education details: PT POC/goals, purpose of PT, HEP instruction Person educated: Patient Education method: Explanation Education comprehension: verbalized understanding  HOME EXERCISE PROGRAM: Access Code: [email or web address] URL: [email or web address] Date: 06/18/2024 Prepared by: Venetia Endo  Exercises - Sit to Stand  - 2 x daily - 7 x weekly - 2-3 sets - 5 reps - Standing March with Counter Support  - 2 x daily - 7 x weekly - 2 sets - 10 reps - Heel Toe Raises with Counter Support  - 2 x daily - 7 x weekly - 2 sets - 10 reps - Seated Long Arc Quad  - 2 x daily - 7 x weekly - 2 sets - 10 reps - 5sec hold   ASSESSMENT:  CLINICAL IMPRESSION: Pt arrived to session today without report of b/l knee pain.  Able to tolerate progression of dynamic balance/gait activities with emphasis on visual scanning vs looking down at feet and obstacle clearance using step-over technique.  More practice with this should help continue improve safety and reduce fall risk.  Pt will continue to benefit from skilled PT services to address deficits and improve function.   OBJECTIVE IMPAIRMENTS:  Abnormal gait, decreased activity tolerance, decreased balance, decreased mobility, decreased strength, and pain.   ACTIVITY LIMITATIONS: lifting, bending, squatting, and locomotion level  PARTICIPATION LIMITATIONS: cleaning, laundry, and community activity  PERSONAL FACTORS: Age, Time since onset of injury/illness/exacerbation, and 1-2 comorbidities: see PMH (peripheral neuropathy/DM) are also affecting patient's functional outcome.   REHAB POTENTIAL: Good  CLINICAL DECISION MAKING: Evolving/moderate complexity  EVALUATION COMPLEXITY: Moderate   GOALS: Goals reviewed with patient? Yes  SHORT TERM GOALS: Target date: 07/03/24 Pt will be instructed on HEP for balance/LE strengthening which she can perform with visual cues (handouts) 5x/week  Baseline: to be initiated at next session Goal status: INITIAL    LONG TERM GOALS: Target date: 07/29/24  Improve ABC >10 points indicating pt significantly notes reduced fear of falling Baseline: 45 Goal status: INITIAL  2.  Improve 5x STS to <15 seconds indicating reduced risk for falls and improved LE strength Baseline: 20 sec Goal status: INITIAL  3.  Improve TUG <14 seconds indicating pt at decreased risk for falls Baseline: 14 sec Goal status: INITIAL  4.  Improve DGI score by 4 points or greater indicating pt at decreased risk for falls Baseline: 12/24 Goal status:  INITIAL     PLAN:  PT FREQUENCY: 2x/week  PT DURATION: 8 weeks  PLANNED INTERVENTIONS: 97110-Therapeutic exercises, 97530- Therapeutic activity, 97112- Neuromuscular re-education, 97535- Self Care, [id number]- Manual therapy, and Patient/Family education  PLAN FOR NEXT SESSION: balance exercises, dynamic stepping tasks and visual scanning c gait drills, train for safe weight shifting and obstacle negotiation with successive visits   Vernell Reges, PT, DPT, OCS  Tracey Wilson, PT 06/29/2024, 4:50 PM  "

## 2024-07-01 ENCOUNTER — Ambulatory Visit

## 2024-07-01 DIAGNOSIS — R2689 Other abnormalities of gait and mobility: Secondary | ICD-10-CM

## 2024-07-01 DIAGNOSIS — R296 Repeated falls: Secondary | ICD-10-CM

## 2024-07-01 NOTE — Therapy (Signed)
 " OUTPATIENT PHYSICAL THERAPY LOWER EXTREMITY/BALANCE TREATMENT   Patient Name: Tracey Wilson MRN: [id number] DOB:[birth year], 78 y.o., female Today's Date: 07/01/2024  END OF SESSION:  PT End of Session - 07/01/24 1513     Visit Number 7    Number of Visits 17    Date for Recertification  07/29/24    Authorization Type PN at visit #10; 2x/week 8 weeks POC    PT Start Time 1515    PT Stop Time 1600    PT Time Calculation (min) 45 min    Equipment Utilized During Treatment Gait belt    Activity Tolerance Patient tolerated treatment well    Behavior During Therapy WFL for tasks assessed/performed               Past Medical History:  Diagnosis Date   Abnormal EKG    Arthritis    osteo, multiple sites   Bipolar 1 disorder, mixed (HCC)    Cancer (HCC)    skin-malignant neoplasm,scc   Cataract cortical, senile    Chronic kidney disease    Cyst of ovary    Diverticulosis 2010   Dysrhythmia    left ventricular systolic dysfunction   HOH (hard of hearing)    right ear   Hyperlipidemia    Hypertension    Lordosis of lumbar region    kyphosis   Other urinary incontinence    Peripheral neuropathy    balls of feet   RBBB (right bundle branch block)    Dr Hester LAMY [phone]   Tremor    UTI (urinary tract infection)    June at [facility]   Valvular heart disease    Vertigo    dental office chairs   Past Surgical History:  Procedure Laterality Date   BREAST BIOPSY Right years ago   2 bx. done. Negative results, benign lumpectomy   BREAST EXCISIONAL BIOPSY Right    CATARACT EXTRACTION Bilateral    COLONOSCOPY  2010,2013,2019   diverticulosis   COLONOSCOPY WITH PROPOFOL  N/A 09/26/2017   Procedure: COLONOSCOPY WITH PROPOFOL ;  Surgeon: Gaylyn Gladis PENNER, MD;  Location: [facility] ENDOSCOPY;  Service: Endoscopy;  Laterality: N/A;   COLONOSCOPY WITH PROPOFOL  N/A 03/20/2021   Procedure: COLONOSCOPY WITH PROPOFOL ;  Surgeon: Maryruth Ole DASEN, MD;  Location: [facility]  ENDOSCOPY;  Service: Endoscopy;  Laterality: N/A;   DIAGNOSTIC LAPAROSCOPY     EYE SURGERY Bilateral 2008,2013   cataract, right then left   FOOT SURGERY Right 2014   bunionectomy with hammertoe   FOREIGN BODY REMOVAL Left 12/04/2018   Procedure: F/B REMOVAL, COMPLICATED;  Surgeon: Lilli Cough, DPM;  Location: [facility];  Service: Podiatry;  Laterality: Left;   removed hammerlock implant and screw x 1   HAMMER TOE SURGERY Left 04/27/2015   Procedure: HAMMER TOE CORRECTION 2ND TOE;  Surgeon: Eva Gay, DPM;  Location: [facility];  Service: Podiatry;  Laterality: Left;   LAPAROSCOPIC BILATERAL SALPINGO OOPHERECTOMY Bilateral 02/08/2020   Procedure: LAPAROSCOPIC BILATERAL SALPINGO OOPHORECTOMY, ABDOMINAL AHESIONS LYSIS;  Surgeon: Schermerhorn, Debby PARAS, MD;  Location: [facility] ORS;  Service: Gynecology;  Laterality: Bilateral;   METATARSAL OSTEOTOMY Left 04/27/2015   Procedure: METATARSAL OSTEOTOMY 1ST METATARSAL AND Proximal phalanx osteotomy.;  Surgeon: Eva Gay, DPM;  Location: [facility];  Service: Podiatry;  Laterality: Left;   MOHS SURGERY  07/10/2018   face and right forearm   RECONSTRUCTION OF ANGULAR DEFORMITY,TOE Left 12/04/2018   Procedure: RECONSTRUCTION OVERLAPPING TOE LEFT;  Surgeon: Lilli Cough, DPM;  Location: [facility];  Service: Podiatry;  Laterality: Left;   TUBAL LIGATION     Patient Active Problem List   Diagnosis Date Noted   Real time reverse transcriptase PCR positive for severe acute respiratory syndrome coronavirus 2 (SARS-CoV-2) 01/23/2021   UTI (urinary tract infection) 01/23/2021   Bipolar 1 disorder, manic, full remission 03/30/2020   Cyst of ovary 03/30/2020   Mixed hyperlipidemia 03/30/2020   RBBB 03/30/2020   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) 03/30/2020   Falls 01/05/2020   Type 2 diabetes mellitus (HCC) 01/05/2020   Tremor of both hands 09/16/2019   Multinodular goiter 10/24/2018    Atrophic kidney, acquired 05/02/2017   Incomplete emptying of bladder 05/02/2017   Overactive detrusor 05/02/2017   Benign essential HTN 10/23/2016   Chronic kidney disease, stage 3 unspecified (HCC) 04/10/2016   LVH (left ventricular hypertrophy) due to hypertensive disease, without heart failure 04/10/2016   Idiopathic peripheral neuropathy 06/01/2015   Arthritis involving multiple sites 03/24/2014   Depressed bipolar I disorder (HCC) 03/24/2014   Personal history of other malignant neoplasm of skin 12/22/2013    PCP: Dr. Jeffie, MD  REFERRING PROVIDER: Lauraine Rocks [facility]  REFERRING DIAG:  Diagnosis  R25.1 (ICD-10-CM) - Tremor  R26.89 (ICD-10-CM) - Imbalance  R53.1 (ICD-10-CM) - Weakness    THERAPY DIAG:  Repeated falls  Balance problem  Rationale for Evaluation and Treatment: Rehabilitation  ONSET DATE: chronic  SUBJECTIVE:   SUBJECTIVE STATEMENT: Pt states she feels like she lost some of her muscle strength this year; she was hospitalized (2 weeks including inpatient rehab) for influenza last December and she feels like she has been weak since then.    A few weeks ago she was mopping up some water from her tub while squat/bending over and she went down on her knees and couldn't get up- her life alert went off and her son was called and he came and helped her up.  She goes 1x/week for a chair exercise class weekly in [place]   PERTINENT HISTORY: Per chart review from neurology 05/06/24: She takes gabapentin  300 mg nightly for her essential tremor. She previously tried primidone but experienced a severe reaction, including inability to walk, after taking 25 mg, leading to discontinuation of the medication. Her psychiatrist assisted in disposing of the remaining primidone tablets.   Her tremor is longstanding, affecting her left hand, which impacts her ability to perform tasks such as pouring liquids. Her father also had similar tremor issues, indicating a familial  component.   She experiences balance issues and frequent falls. She has been participating in chair exercises at the [facility] and [facility] for the past four weeks to improve her balance and strength. She describes a specific incident where she fell on her knees while at home, which triggered her Life Protect device to alert her son for assistance.   She has a history of neuropathy and was hospitalized last December for influenza and a urinary tract infection, which resulted in a loss of strength in her arms and legs. She uses her arms to assist in standing from seated positions, especially in settings without armrests.   Her current medication regimen includes gabapentin  300 mg at night.   PAIN:  Are you having pain? Not currently  PRECAUTIONS: Fall  RED FLAGS: None   WEIGHT BEARING RESTRICTIONS: No  FALLS:  Has patient fallen in last 6 months? Yes. 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  DIAGNOSTIC FINDINGS:  Per chart review: 08/12/2023  EMG: Impression: Abnormal study.  There is electrodiagnostic evidence of a chronic, severe sensorimotor polyneuropathy in the lower extremities.    06/02/2022 CT HEAD WO/ CT CERVICAL SPINE WO --No evidence of acute  intracranial pathology.  --No fracture or traumatic listhesis of the cervical spine.     PATIENT SURVEYS:  ABC: 45%  COGNITION: Overall cognitive status: Within functional limits for tasks assessed     SENSATION: WFL  POSTURE: rounded shoulders  PALPATION: Deferred today  LOWER EXTREMITY ROM:  Active ROM Right eval Left eval  Hip flexion    Hip extension    Hip abduction    Hip adduction    Hip internal rotation    Hip external rotation    Knee flexion    Knee extension    Ankle dorsiflexion    Ankle plantarflexion    Ankle inversion    Ankle eversion     (Blank rows = not tested)  LOWER EXTREMITY MMT:  MMT Right 06/18/24 Left 06/18/24  Hip flexion 3+ 4-  Hip extension    Hip abduction (seated) 4+ 4+  Hip adduction (seated) 5 5  Hip internal rotation    Hip external rotation    Knee flexion 4+ 5  Knee extension 4-* 4-  Ankle dorsiflexion 4 4  Ankle plantarflexion 4 (8 SL heel raise) 4 (8 SL heel raise)  Ankle inversion    Ankle eversion     (Blank rows = not tested)   FUNCTIONAL TESTS:  TUG: 14 seconds 5x STS 20 seconds  Balance: SLS <5 seconds b/l; feet together with EO/EC: increased trunk sway with EC; feet together EO/EC on foam: increased trunk sway EO and has to hold on with EC for stability   GAIT: Distance walked: Pt amb without any AD, decreased gait speed, decreased stride length, decreased trunk rotation noted  COORDINATION Normal coordination for finger to nose/PT finger R and L, alternating toe taps, heel to shin R and L                                                                                                           TREATMENT DATE: 07/01/24  Subjective: Patient reports her knees are not sore upon arrival.  No falls, but she did lose her balance when she was quickly walking around a rocking chair that  was positioned in a different location than usual at her home.  Regained balance with leaning onto the furniture.     Objective: Pain: 0/10 today  Therapeutic Exercise: Nustep: level 3, x7 minutes LAQ: 3# 2x6 ea LE Marches: seated 3# weights 2x10 Standing heel raises: 2x10  Therapeutic Activities Mini squat : 2x10   Sit to stand x 5, 3 sets from chair with small airex- not today Front step ups 6 inch step: x10 leading with R, x10 leading with L- today ascend/descend 4 stairs x 2 with b/l railing Side stepping 4 laps in parallel bars   Neuro re-ed: PT (close supervision with gait belt) Dynamic march in // bars; 4x D/B SLS on airex : R and L 4x ea 5-10 second intervals- not today Feet together on airex with UE movements: in sagital plane and in frontal plane- 3 min Amb with scanning environment for obstacles: in hallway 4 laps- scanning for letters on walls and stating out loud Standing alternating toe taps on 6 inch step: x 30 second intervals, 3 rounds- keeping eyes looking forward, not at feet Hurdles: forward: 3x ea direction, R LE and then L LE lead (6 inch height today) Figure 8's around cones- 5x    PATIENT EDUCATION:  Education details: PT POC/goals, purpose of PT, HEP instruction Person educated: Patient Education method: Explanation Education comprehension: verbalized understanding  HOME EXERCISE PROGRAM: Access Code: [email or web address] URL: [email or web address] Date: 06/18/2024 Prepared by: Venetia Endo  Exercises - Sit to Stand  - 2 x daily - 7 x weekly - 2-3 sets - 5 reps - Standing March with Counter Support  - 2 x daily - 7 x weekly - 2 sets - 10 reps - Heel Toe Raises with Counter Support  - 2 x daily - 7 x weekly - 2 sets - 10 reps - Seated Long Arc Quad  - 2 x daily - 7 x weekly - 2 sets - 10 reps - 5sec hold   ASSESSMENT:  CLINICAL IMPRESSION: Pt arrived to session today without report of b/l knee pain.  Able to tolerate progression of dynamic balance/gait activities today.  PT close supervision and gait belt for standing exercises.  She does demonstrate  some crossing over midline to steady herself with stepping during dynamic gait activities.  More practice with this should help continue improve safety and reduce fall risk.  Pt notes feeling fatigued at end of session.  PT amb with her to vehicle at end of session.   Pt will continue to benefit from skilled PT services to address deficits and improve function.   OBJECTIVE IMPAIRMENTS: Abnormal gait, decreased activity tolerance, decreased balance, decreased mobility, decreased strength, and pain.   ACTIVITY LIMITATIONS: lifting, bending, squatting, and locomotion level  PARTICIPATION LIMITATIONS: cleaning, laundry, and community activity  PERSONAL FACTORS: Age, Time since onset of injury/illness/exacerbation, and 1-2 comorbidities: see PMH (peripheral neuropathy/DM) are also affecting patient's functional outcome.   REHAB POTENTIAL: Good  CLINICAL DECISION MAKING: Evolving/moderate complexity  EVALUATION COMPLEXITY: Moderate   GOALS: Goals reviewed with patient? Yes  SHORT TERM GOALS: Target date: 07/03/24 Pt will be instructed on HEP for balance/LE strengthening which she can perform with visual cues (handouts) 5x/week  Baseline: to be initiated at next session Goal status: INITIAL    LONG TERM GOALS: Target date: 07/29/24  Improve ABC >10 points indicating pt significantly notes reduced fear of falling Baseline: 45 Goal status: INITIAL  2.  Improve 5x STS to <15 seconds indicating reduced risk for falls  and improved LE strength Baseline: 20 sec Goal status: INITIAL  3.  Improve TUG <14 seconds indicating pt at decreased risk for falls Baseline: 14 sec Goal status: INITIAL  4.  Improve DGI score by 4 points or greater indicating pt at decreased risk for falls Baseline: 12/24 Goal status: INITIAL     PLAN:  PT FREQUENCY: 2x/week  PT DURATION: 8 weeks  PLANNED INTERVENTIONS: 97110-Therapeutic exercises, 97530- Therapeutic activity, 97112- Neuromuscular  re-education, 97535- Self Care, [id number]- Manual therapy, and Patient/Family education  PLAN FOR NEXT SESSION: balance exercises, dynamic stepping tasks and visual scanning c gait drills, train for safe weight shifting and obstacle negotiation with successive visits   Vernell Reges, PT, DPT, OCS  Akane Tessier E Ludell Zacarias, PT 07/01/2024, 3:16 PM  "

## 2024-07-06 ENCOUNTER — Ambulatory Visit

## 2024-07-08 ENCOUNTER — Ambulatory Visit: Attending: Physician Assistant

## 2024-07-08 DIAGNOSIS — R2689 Other abnormalities of gait and mobility: Secondary | ICD-10-CM | POA: Insufficient documentation

## 2024-07-08 DIAGNOSIS — R296 Repeated falls: Secondary | ICD-10-CM | POA: Insufficient documentation

## 2024-07-08 NOTE — Therapy (Signed)
 " OUTPATIENT PHYSICAL THERAPY LOWER EXTREMITY/BALANCE TREATMENT   Patient Name: Tracey Wilson MRN: [id number] DOB:07-19-1945, 79 y.o., female Today's Date: 07/08/2024  END OF SESSION:  PT End of Session - 07/08/24 1500     Visit Number 8    Number of Visits 17    Date for Recertification  07/29/24    Authorization Type PN at visit #10; 2x/week 8 weeks POC    PT Start Time 1500    PT Stop Time 1545    PT Time Calculation (min) 45 min    Equipment Utilized During Treatment Gait belt    Activity Tolerance Patient tolerated treatment well    Behavior During Therapy WFL for tasks assessed/performed               Past Medical History:  Diagnosis Date   Abnormal EKG    Arthritis    osteo, multiple sites   Bipolar 1 disorder, mixed (HCC)    Cancer (HCC)    skin-malignant neoplasm,scc   Cataract cortical, senile    Chronic kidney disease    Cyst of ovary    Diverticulosis 2010   Dysrhythmia    left ventricular systolic dysfunction   HOH (hard of hearing)    right ear   Hyperlipidemia    Hypertension    Lordosis of lumbar region    kyphosis   Other urinary incontinence    Peripheral neuropathy    balls of feet   RBBB (right bundle branch block)    Dr Hester LAMY [phone]   Tremor    UTI (urinary tract infection)    June at [facility]   Valvular heart disease    Vertigo    dental office chairs   Past Surgical History:  Procedure Laterality Date   BREAST BIOPSY Right years ago   2 bx. done. Negative results, benign lumpectomy   BREAST EXCISIONAL BIOPSY Right    CATARACT EXTRACTION Bilateral    COLONOSCOPY  2010,2013,2019   diverticulosis   COLONOSCOPY WITH PROPOFOL  N/A 09/26/2017   Procedure: COLONOSCOPY WITH PROPOFOL ;  Surgeon: Gaylyn Gladis PENNER, MD;  Location: [facility] END[facility]COPY;  Service: Endoscopy;  Laterality: N/A;   COLONOSCOPY WITH PROPOFOL  N/A 03/20/2021   Procedure: COLONOSCOPY WITH PROPOFOL ;  Surgeon: Maryruth Ole DASEN, MD;  Location: [facility] ENDOSCOPY;   Service: Endoscopy;  Laterality: N/A;   DIAGNOSTIC LAPAROSCOPY     EYE SURGERY Bilateral 2008,2013   cataract, right then left   FOOT SURGERY Right 2014   bunionectomy with hammertoe   FOREIGN BODY REMOVAL Left 12/04/2018   Procedure: F/B REMOVAL, COMPLICATED;  Surgeon: Lilli Cough, DPM;  Location: [facility];  Service: Podiatry;  Laterality: Left;   removed hammerlock implant and screw x 1   HAMMER TOE SURGERY Left 04/27/2015   Procedure: HAMMER TOE CORRECTION 2ND TOE;  Surgeon: Eva Gay, DPM;  Location: [facility];  Service: Podiatry;  Laterality: Left;   LAPAROSCOPIC BILATERAL SALPINGO OOPHERECTOMY Bilateral 02/08/2020   Procedure: LAPAROSCOPIC BILATERAL SALPINGO OOPHORECTOMY, ABDOMINAL AHESIONS LYSIS;  Surgeon: Schermerhorn, Debby PARAS, MD;  Location: [facility] OR[facility];  Service: Gynecology;  Laterality: Bilateral;   METATARSAL OSTEOTOMY Left 04/27/2015   Procedure: METATARSAL OSTEOTOMY 1ST METATARSAL AND Proximal phalanx osteotomy.;  Surgeon: Eva Gay, DPM;  Location: [facility];  Service: Podiatry;  Laterality: Left;   MOHS SURGERY  07/10/2018   face and right forearm   RECONSTRUCTION OF ANGULAR DEFORMITY,TOE Left 12/04/2018   Procedure: RECONSTRUCTION OVERLAPPING TOE LEFT;  Surgeon: Lilli Cough, DPM;  Location: [facility];  Service: Podiatry;  Laterality: Left;   TUBAL LIGATION     Patient Active Problem List   Diagnosis Date Noted   Real time reverse transcriptase PCR positive for severe acute respiratory syndrome coronavirus 2 (SARS-CoV-2) 01/23/2021   UTI (urinary tract infection) 01/23/2021   Bipolar 1 disorder, manic, full remission 03/30/2020   Cyst of ovary 03/30/2020   Mixed hyperlipidemia 03/30/2020   RBBB 03/30/2020   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) 03/30/2020   Falls 01/05/2020   Type 2 diabetes mellitus (HCC) 01/05/2020   Tremor of both hands 09/16/2019   Multinodular goiter 10/24/2018   Atrophic  kidney, acquired 05/02/2017   Incomplete emptying of bladder 05/02/2017   Overactive detrusor 05/02/2017   Benign essential HTN 10/23/2016   Chronic kidney disease, stage 3 unspecified (HCC) 04/10/2016   LVH (left ventricular hypertrophy) due to hypertensive disease, without heart failure 04/10/2016   Idiopathic peripheral neuropathy 06/01/2015   Arthritis involving multiple sites 03/24/2014   Depressed bipolar I disorder (HCC) 03/24/2014   Personal history of other malignant neoplasm of skin 12/22/2013    PCP: Dr. Jeffie, MD  REFERRING PROVIDER: Lauraine Rocks [facility]  REFERRING DIAG:  Diagnosis  R25.1 (ICD-10-CM) - Tremor  R26.89 (ICD-10-CM) - Imbalance  R53.1 (ICD-10-CM) - Weakness    THERAPY DIAG:  Repeated falls  Balance problem  Rationale for Evaluation and Treatment: Rehabilitation  ONSET DATE: chronic  SUBJECTIVE:   SUBJECTIVE STATEMENT: Pt states she feels like she lost some of her muscle strength this year; she was hospitalized (2 weeks including inpatient rehab) for influenza last December and she feels like she has been weak since then.    A few weeks ago she was mopping up some water from her tub while squat/bending over and she went down on her knees and couldn't get up- her life alert went off and her son was called and he came and helped her up.  She goes 1x/week for a chair exercise class weekly in [place]   PERTINENT HISTORY: Per chart review from neurology 05/06/24: She takes gabapentin  300 mg nightly for her essential tremor. She previously tried primidone but experienced a severe reaction, including inability to walk, after taking 25 mg, leading to discontinuation of the medication. Her psychiatrist assisted in disposing of the remaining primidone tablets.   Her tremor is longstanding, affecting her left hand, which impacts her ability to perform tasks such as pouring liquids. Her father also had similar tremor issues, indicating a familial component.    She experiences balance issues and frequent falls. She has been participating in chair exercises at the [facility] for the past four weeks to improve her balance and strength. She describes a specific incident where she fell on her knees while at home, which triggered her Life Protect device to alert her son for assistance.   She has a history of neuropathy and was hospitalized last December for influenza and a urinary tract infection, which resulted in a loss of strength in her arms and legs. She uses her arms to assist in standing from seated positions, especially in settings without armrests.   Her current medication regimen includes gabapentin  300 mg at night.   PAIN:  Are you having pain? Not currently  PRECAUTIONS: Fall  RED FLAGS: None   WEIGHT BEARING RESTRICTIONS: No  FALLS:  Has patient fallen in last 6 months? Yes. 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  DIAGNOSTIC FINDINGS:  Per chart review: 08/12/2023  EMG: Impression: Abnormal study.  There is electrodiagnostic evidence of a chronic, severe sensorimotor polyneuropathy in the lower extremities.    06/02/2022 CT HEAD WO/ CT CERVICAL SPINE WO --No evidence of acute intracranial  pathology.  --No fracture or traumatic listhesis of the cervical spine.     PATIENT SURVEYS:  ABC: 45%  COGNITION: Overall cognitive status: Within functional limits for tasks assessed     SENSATION: WFL  POSTURE: rounded shoulders  PALPATION: Deferred today  LOWER EXTREMITY ROM:  Active ROM Right eval Left eval  Hip flexion    Hip extension    Hip abduction    Hip adduction    Hip internal rotation    Hip external rotation    Knee flexion    Knee extension    Ankle dorsiflexion    Ankle plantarflexion    Ankle inversion    Ankle eversion     (Blank rows = not tested)  LOWER EXTREMITY MMT:  MMT Right 06/18/24 Left 06/18/24  Hip flexion 3+ 4-  Hip extension    Hip abduction (seated) 4+ 4+  Hip adduction (seated) 5 5  Hip internal rotation    Hip external rotation    Knee flexion 4+ 5  Knee extension 4-* 4-  Ankle dorsiflexion 4 4  Ankle plantarflexion 4 (8 SL heel raise) 4 (8 SL heel raise)  Ankle inversion    Ankle eversion     (Blank rows = not tested)   FUNCTIONAL TESTS:  TUG: 14 seconds 5x STS 20 seconds  Balance: SLS <5 seconds b/l; feet together with EO/EC: increased trunk sway with EC; feet together EO/EC on foam: increased trunk sway EO and has to hold on with EC for stability   GAIT: Distance walked: Pt amb without any AD, decreased gait speed, decreased stride length, decreased trunk rotation noted  COORDINATION Normal coordination for finger to nose/PT finger R and L, alternating toe taps, heel to shin R and L                                                                                                           TREATMENT DATE: 07/08/24 Subjective: Patient reports she has not fallen since last session.  Her knees are sore upon arrival.  Was sick on Monday so she cancelled PT.    Objective:  Pain: 2-3/10 today  Therapeutic Exercise: Nustep: level 3, x7 minutes LAQ: 3# 2x6 ea LE Marches: seated 3# weights 2x10 Standing heel  raises: 2x10  Therapeutic Activities Mini squat : 2x10- not today Sit to stand x 5, 3 sets from chair with small airex Front step ups 6 inch step: x10 leading with R, x10 leading with L- today ascend/descend 4 stairs x 2 with b/l railing Side stepping 4 laps in parallel bars  Neuro re-ed: PT (close supervision with gait belt) Dynamic march in // bars; 4x D/B SLS on airex : R and L 4x ea 5-10 second intervals- not today Feet together on airex with UE movements: in sagital plane and in frontal plane- 3 min- not today Amb with scanning environment for obstacles: in hallway 4 laps- scanning for letters on walls and stating out loud Amb looking up/down head turns- 2 laps in hallway Standing alternating toe taps on 6 inch step: x 30 second intervals, 3 rounds- keeping eyes looking forward, not at feet- not today Hurdles: forward: 3x ea direction, R LE and then L LE lead (6 inch height today)- not today Figure 8's around cones- 5x Step over obstacle in hallway (6 inch box) 6x and then continue amb forward Tandem walk: 4x with PT SBA x 20 ft    PATIENT EDUCATION:  Education details: PT POC/goals, purpose of PT, HEP instruction Person educated: Patient Education method: Explanation Education comprehension: verbalized understanding  HOME EXERCISE PROGRAM: Access Code: [email or web address] URL: [email or web address] Date: 06/18/2024 Prepared by: Venetia Endo  Exercises - Sit to Stand  - 2 x daily - 7 x weekly - 2-3 sets - 5 reps - Standing March with Counter Support  - 2 x daily - 7 x weekly - 2 sets - 10 reps - Heel Toe Raises with Counter Support  - 2 x daily - 7 x weekly - 2 sets - 10 reps - Seated Long Arc Quad  - 2 x daily - 7 x weekly - 2 sets - 10 reps - 5sec hold   ASSESSMENT:  CLINICAL IMPRESSION: Pt able to tolerate progression of dynamic balance/gait activities today.  PT close supervision and gait belt for standing exercises.  She does demonstrate some crossing over  midline to steady herself with stepping during dynamic gait activities.  More practice with this should help continue improve safety and reduce fall risk.  Pt notes feeling moderately fatigued at end of session.  Able to amb to vehicle independently at end of session today.  Pt will continue to benefit from skilled PT services to address deficits and improve function.   OBJECTIVE IMPAIRMENTS: Abnormal gait, decreased activity tolerance, decreased balance, decreased mobility, decreased strength, and pain.   ACTIVITY LIMITATIONS: lifting, bending, squatting, and locomotion level  PARTICIPATION LIMITATIONS: cleaning, laundry, and community activity  PERSONAL FACTORS: Age, Time since onset of injury/illness/exacerbation, and 1-2 comorbidities: see PMH (peripheral neuropathy/DM) are also affecting patient's functional outcome.   REHAB POTENTIAL: Good  CLINICAL DECISION MAKING: Evolving/moderate complexity  EVALUATION COMPLEXITY: Moderate   GOALS: Goals reviewed with patient? Yes  SHORT TERM GOALS: Target date: 07/03/24 Pt will be instructed on HEP for balance/LE strengthening which she can perform with visual cues (handouts) 5x/week  Baseline: to be initiated at next session Goal status: INITIAL    LONG TERM GOALS: Target date: 07/29/24  Improve ABC >10 points indicating pt significantly notes reduced fear of falling Baseline: 45 Goal status: INITIAL  2.  Improve 5x STS to <15 seconds indicating reduced risk  for falls and improved LE strength Baseline: 20 sec Goal status: INITIAL  3.  Improve TUG <14 seconds indicating pt at decreased risk for falls Baseline: 14 sec Goal status: INITIAL  4.  Improve DGI score by 4 points or greater indicating pt at decreased risk for falls Baseline: 12/24 Goal status: INITIAL     PLAN:  PT FREQUENCY: 2x/week  PT DURATION: 8 weeks  PLANNED INTERVENTIONS: 97110-Therapeutic exercises, 97530- Therapeutic activity, 97112- Neuromuscular  re-education, 97535- Self Care, [id number]- Manual therapy, and Patient/Family education  PLAN FOR NEXT SESSION: balance exercises, dynamic stepping tasks and visual scanning c gait drills, train for safe weight shifting and obstacle negotiation with successive visits   Vernell Reges, PT, DPT, OCS  Natash Berman E Sangeeta Youse, PT 07/08/2024, 3:01 PM  "

## 2024-07-13 ENCOUNTER — Ambulatory Visit

## 2024-07-13 DIAGNOSIS — R296 Repeated falls: Secondary | ICD-10-CM | POA: Diagnosis not present

## 2024-07-13 DIAGNOSIS — R2689 Other abnormalities of gait and mobility: Secondary | ICD-10-CM

## 2024-07-13 NOTE — Therapy (Unsigned)
 " OUTPATIENT PHYSICAL THERAPY LOWER EXTREMITY/BALANCE TREATMENT   Patient Name: Tracey Wilson MRN: [id number] DOB:1945/12/16, 79 y.o., female Today's Date: 07/13/2024  END OF SESSION:  PT End of Session - 07/13/24 1513     Visit Number 9    Number of Visits 17    Date for Recertification  07/29/24    Authorization Type PN at visit #10; 2x/week 8 weeks POC    PT Start Time 1510    Equipment Utilized During Treatment Gait belt    Activity Tolerance Patient tolerated treatment well    Behavior During Therapy WFL for tasks assessed/performed               Past Medical History:  Diagnosis Date   Abnormal EKG    Arthritis    osteo, multiple sites   Bipolar 1 disorder, mixed (HCC)    Cancer (HCC)    skin-malignant neoplasm,scc   Cataract cortical, senile    Chronic kidney disease    Cyst of ovary    Diverticulosis 2010   Dysrhythmia    left ventricular systolic dysfunction   HOH (hard of hearing)    right ear   Hyperlipidemia    Hypertension    Lordosis of lumbar region    kyphosis   Other urinary incontinence    Peripheral neuropathy    balls of feet   RBBB (right bundle branch block)    Dr Hester LAMY [phone]   Tremor    UTI (urinary tract infection)    June at [facility] ER   Valvular heart disease    Vertigo    dental office chairs   Past Surgical History:  Procedure Laterality Date   BREAST BIOPSY Right years ago   2 bx. done. Negative results, benign lumpectomy   BREAST EXCISIONAL BIOPSY Right    CATARACT EXTRACTION Bilateral    COLONOSCOPY  2010,2013,2019   diverticulosis   COLONOSCOPY WITH PROPOFOL  N/A 09/26/2017   Procedure: COLONOSCOPY WITH PROPOFOL ;  Surgeon: Gaylyn Gladis PENNER, MD;  Location: [facility] ENDOSCOPY;  Service: Endoscopy;  Laterality: N/A;   COLONOSCOPY WITH PROPOFOL  N/A 03/20/2021   Procedure: COLONOSCOPY WITH PROPOFOL ;  Surgeon: Maryruth Ole DASEN, MD;  Location: [facility] ENDOSCOPY;  Service: Endoscopy;  Laterality: N/A;   DIAGNOSTIC  LAPAROSCOPY     EYE SURGERY Bilateral 2008,2013   cataract, right then left   FOOT SURGERY Right 2014   bunionectomy with hammertoe   FOREIGN BODY REMOVAL Left 12/04/2018   Procedure: F/B REMOVAL, COMPLICATED;  Surgeon: Lilli Cough, DPM;  Location: [facility];  Service: Podiatry;  Laterality: Left;   removed hammerlock implant and screw x 1   HAMMER TOE SURGERY Left 04/27/2015   Procedure: HAMMER TOE CORRECTION 2ND TOE;  Surgeon: Eva Gay, DPM;  Location: [facility]ERY [facility];  Service: Podiatry;  Laterality: Left;   LAPAROSCOPIC BILATERAL SALPINGO OOPHERECTOMY Bilateral 02/08/2020   Procedure: LAPAROSCOPIC BILATERAL SALPINGO OOPHORECTOMY, ABDOMINAL AHESIONS LYSIS;  Surgeon: Schermerhorn, Debby PARAS, MD;  Location: [facility] ORS;  Service: Gynecology;  Laterality: Bilateral;   METATARSAL OSTEOTOMY Left 04/27/2015   Procedure: METATARSAL OSTEOTOMY 1ST METATARSAL AND Proximal phalanx osteotomy.;  Surgeon: Eva Gay, DPM;  Location: [facility];  Service: Podiatry;  Laterality: Left;   MOHS SURGERY  07/10/2018   face and right forearm   RECONSTRUCTION OF ANGULAR DEFORMITY,TOE Left 12/04/2018   Procedure: RECONSTRUCTION OVERLAPPING TOE LEFT;  Surgeon: Lilli Cough, DPM;  Location: [facility];  Service: Podiatry;  Laterality: Left;   TUBAL LIGATION  Patient Active Problem List   Diagnosis Date Noted   Real time reverse transcriptase PCR positive for severe acute respiratory syndrome coronavirus 2 (SARS-CoV-2) 01/23/2021   UTI (urinary tract infection) 01/23/2021   Bipolar 1 disorder, manic, full remission 03/30/2020   Cyst of ovary 03/30/2020   Mixed hyperlipidemia 03/30/2020   RBBB 03/30/2020   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) 03/30/2020   Falls 01/05/2020   Type 2 diabetes mellitus (HCC) 01/05/2020   Tremor of both hands 09/16/2019   Multinodular goiter 10/24/2018   Atrophic kidney, acquired 05/02/2017   Incomplete emptying of  bladder 05/02/2017   Overactive detrusor 05/02/2017   Benign essential HTN 10/23/2016   Chronic kidney disease, stage 3 unspecified (HCC) 04/10/2016   LVH (left ventricular hypertrophy) due to hypertensive disease, without heart failure 04/10/2016   Idiopathic peripheral neuropathy 06/01/2015   Arthritis involving multiple sites 03/24/2014   Depressed bipolar I disorder (HCC) 03/24/2014   Personal history of other malignant neoplasm of skin 12/22/2013    PCP: Dr. Jeffie, MD  REFERRING PROVIDER: Lauraine Rocks [facility]  REFERRING DIAG:  Diagnosis  R25.1 (ICD-10-CM) - Tremor  R26.89 (ICD-10-CM) - Imbalance  R53.1 (ICD-10-CM) - Weakness    THERAPY DIAG:  Repeated falls  Balance problem  Rationale for Evaluation and Treatment: Rehabilitation  ONSET DATE: chronic  SUBJECTIVE:   SUBJECTIVE STATEMENT: Pt states she feels like she lost some of her muscle strength this year; she was hospitalized (2 weeks including inpatient rehab) for influenza last December and she feels like she has been weak since then.    A few weeks ago she was mopping up some water from her tub while squat/bending over and she went down on her knees and couldn't get up- her life alert went off and her son was called and he came and helped her up.  She goes 1x/week for a chair exercise class weekly in [place]   PERTINENT HISTORY: Per chart review from neurology 05/06/24: She takes gabapentin  300 mg nightly for her essential tremor. She previously tried primidone but experienced a severe reaction, including inability to walk, after taking 25 mg, leading to discontinuation of the medication. Her psychiatrist assisted in disposing of the remaining primidone tablets.   Her tremor is longstanding, affecting her left hand, which impacts her ability to perform tasks such as pouring liquids. Her father also had similar tremor issues, indicating a familial component.   She experiences balance issues and frequent falls.  She has been participating in chair exercises at the [facility] and [facility] for the past four weeks to improve her balance and strength. She describes a specific incident where she fell on her knees while at home, which triggered her Life Protect device to alert her son for assistance.   She has a history of neuropathy and was hospitalized last December for influenza and a urinary tract infection, which resulted in a loss of strength in her arms and legs. She uses her arms to assist in standing from seated positions, especially in settings without armrests.   Her current medication regimen includes gabapentin  300 mg at night.   PAIN:  Are you having pain? Not currently  PRECAUTIONS: Fall  RED FLAGS: None   WEIGHT BEARING RESTRICTIONS: No  FALLS:  Has patient fallen in last 6 months? Yes. 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  DIAGNOSTIC FINDINGS:  Per chart review: 08/12/2023  EMG: Impression: Abnormal study.  There is electrodiagnostic evidence of a chronic, severe sensorimotor polyneuropathy in the lower extremities.    06/02/2022 CT HEAD WO/ CT CERVICAL SPINE WO --No evidence of acute intracranial pathology.  --No fracture or traumatic listhesis of the  cervical spine.     PATIENT SURVEYS:  ABC: 45%  COGNITION: Overall cognitive status: Within functional limits for tasks assessed     SENSATION: WFL  POSTURE: rounded shoulders  PALPATION: Deferred today  LOWER EXTREMITY ROM:  Active ROM Right eval Left eval  Hip flexion    Hip extension    Hip abduction    Hip adduction    Hip internal rotation    Hip external rotation    Knee flexion    Knee extension    Ankle dorsiflexion    Ankle plantarflexion    Ankle inversion    Ankle eversion     (Blank rows = not tested)  LOWER EXTREMITY MMT:  MMT Right 06/18/24 Left 06/18/24  Hip flexion 3+ 4-  Hip extension    Hip abduction (seated) 4+ 4+  Hip adduction (seated) 5 5  Hip internal rotation    Hip external rotation    Knee flexion 4+ 5  Knee extension 4-* 4-  Ankle dorsiflexion 4 4  Ankle plantarflexion 4 (8 SL heel raise) 4 (8 SL heel raise)  Ankle inversion    Ankle eversion     (Blank rows = not tested)   FUNCTIONAL TESTS:  TUG: 14 seconds 5x STS 20 seconds  Balance: SLS <5 seconds b/l; feet together with EO/EC: increased trunk sway with EC; feet together EO/EC on foam: increased trunk sway EO and has to hold on with EC for stability   GAIT: Distance walked: Pt amb without any AD, decreased gait speed, decreased stride length, decreased trunk rotation noted  COORDINATION Normal coordination for finger to nose/PT finger R and L, alternating toe taps, heel to shin R and L                                                                                                           TREATMENT DATE: 07/13/2024  Subjective: Patient reports she has not fallen since last session.  Has been compliant with HEP, working to incorporate using steps at home for therex progression. Has begun taking collagen for knee health since last session. Remains active in the community.   Objective: Pain:  2-3/10 today  Therapeutic Exercise: Nustep: level 3, x7 minutes LAQ: 3#  2x6 ea LE Marches: seated 3# weights 2x10 Standing heel raises: 2x10  Therapeutic Activities Mini squat : 2x10- not today Sit to stand x 5, 3 sets from chair with small airex Front step ups 6 inch step: x10 leading with R, x10 leading with L- today ascend/descend 4 stairs x 2 with b/l railing Side stepping 4 laps in parallel bars - not today  Neuro re-ed: PT (close supervision with gait belt) Dynamic march in // bars; 4x D/B - NT SLS on airex : R and L 4x ea 5-10 second intervals- not today Feet together on airex with UE movements: in sagital plane and in frontal plane- 3 min- not today Amb with scanning environment for obstacles: in hallway 4 laps- scanning for letters on walls and stating out loud Amb looking up/down head turns- 2 laps in hallway - NT Standing alternating toe taps on 6 inch step: x 30 second intervals, 3 rounds- keeping eyes looking forward, not at feet- not today Hurdles: forward: 3x ea direction, R LE and then L LE lead (6 inch height today)- not today Figure 8's around cones- 5x  Step over obstacle in hallway (6 inch box) 6x and then continue amb forward Tandem walk: 4x with PT SBA x 20 ft - NT    PATIENT EDUCATION:  Education details: PT POC/goals, purpose of PT, HEP instruction Person educated: Patient Education method: Explanation Education comprehension: verbalized understanding  HOME EXERCISE PROGRAM: Access Code: [email or web address] URL: [email or web address] Date: 06/18/2024 Prepared by: Venetia Endo  Exercises - Sit to Stand  - 2 x daily - 7 x weekly - 2-3 sets - 5 reps - Standing March with Counter Support  - 2 x daily - 7 x weekly - 2 sets - 10 reps - Heel Toe Raises with Counter Support  - 2 x daily - 7 x weekly - 2 sets - 10 reps - Seated Long Arc Quad  - 2 x daily - 7 x weekly - 2 sets - 10 reps - 5sec hold - Stair climbing at home - 2x daily x 8 steps per leg   ASSESSMENT:  CLINICAL IMPRESSION: Pt able to tolerate progression of  dynamic balance/gait activities today.  PT close supervision and gait belt for standing exercises.  She does demonstrate some crossing over midline to steady herself with stepping during dynamic gait activities.  More practice with this should help continue improve safety and reduce fall risk. Have begun doing stair climbing in HEP as well as implementing more dynamic balance therex. Pt notes feeling moderately fatigued at end of session.  Able to amb to vehicle independently at end of session today.  Pt will continue to benefit from skilled PT services to address deficits and improve function.   OBJECTIVE IMPAIRMENTS: Abnormal gait, decreased activity tolerance, decreased balance, decreased mobility, decreased strength, and pain.   ACTIVITY LIMITATIONS: lifting, bending, squatting, and locomotion level  PARTICIPATION LIMITATIONS: cleaning, laundry, and community activity  PERSONAL FACTORS: Age, Time since onset of injury/illness/exacerbation, and 1-2 comorbidities: see PMH (peripheral neuropathy/DM) are also affecting patient's functional outcome.   REHAB POTENTIAL: Good  CLINICAL DECISION MAKING: Evolving/moderate complexity  EVALUATION COMPLEXITY: Moderate   GOALS: Goals reviewed with patient? Yes  SHORT TERM GOALS: Target date: 07/03/24 Pt will be instructed on HEP for balance/LE strengthening which she can perform with visual cues (handouts) 5x/week  Baseline: to be initiated at next session Goal status: INITIAL  LONG TERM GOALS: Target date: 07/29/24  Improve ABC >10 points indicating pt significantly notes reduced fear of falling Baseline: 45 Goal status: INITIAL  2.  Improve 5x STS to <15 seconds indicating reduced risk for falls and improved LE strength Baseline: 20 sec Goal status: INITIAL  3.  Improve TUG <14 seconds indicating pt at decreased risk for falls Baseline: 14 sec Goal status: INITIAL  4.  Improve DGI score by 4 points or greater indicating pt at  decreased risk for falls Baseline: 12/24 Goal status: INITIAL     PLAN:  PT FREQUENCY: 2x/week  PT DURATION: 8 weeks  PLANNED INTERVENTIONS: 97110-Therapeutic exercises, 97530- Therapeutic activity, 97112- Neuromuscular re-education, 97535- Self Care, [id number]- Manual therapy, and Patient/Family education  PLAN FOR NEXT SESSION: balance exercises, dynamic stepping tasks and visual scanning c gait drills, train for safe weight shifting and obstacle negotiation with successive visits  Juliene Levine, SPT  Vernell Reges, PT, DPT, OCS  Juliene Levine, Student-PT 07/13/2024, 3:15 PM  Student physical therapist under direct supervision of licensed physical therapist during the entirety of the session.  I have personally read, edited and approve of the note as written.  Vernell Reges, PT, DPT, OCS  "

## 2024-07-15 ENCOUNTER — Ambulatory Visit

## 2024-08-05 ENCOUNTER — Other Ambulatory Visit: Payer: Self-pay

## 2024-08-05 DIAGNOSIS — N393 Stress incontinence (female) (male): Secondary | ICD-10-CM

## 2024-08-05 DIAGNOSIS — N3946 Mixed incontinence: Secondary | ICD-10-CM

## 2024-08-05 MED ORDER — GEMTESA 75 MG PO TABS: 75.0000 mg | ORAL_TABLET | Freq: Every day | ORAL | 1 refills | Status: AC

## 2024-12-21 ENCOUNTER — Ambulatory Visit: Admitting: Urology
